# Patient Record
Sex: Female | Born: 1960 | Race: White | Hispanic: No | Smoking: Former smoker
Health system: Southern US, Community
[De-identification: ages and names within clinical notes are randomized; demographics above are authoritative.]

## PROBLEM LIST (undated history)

## (undated) DIAGNOSIS — K219 Gastro-esophageal reflux disease without esophagitis: Secondary | ICD-10-CM

## (undated) DIAGNOSIS — E079 Disorder of thyroid, unspecified: Secondary | ICD-10-CM

## (undated) DIAGNOSIS — I10 Essential (primary) hypertension: Secondary | ICD-10-CM

## (undated) DIAGNOSIS — T7840XA Allergy, unspecified, initial encounter: Secondary | ICD-10-CM

## (undated) HISTORY — DX: Allergy, unspecified, initial encounter: T78.40XA

## (undated) HISTORY — DX: Gastro-esophageal reflux disease without esophagitis: K21.9

## (undated) HISTORY — DX: Essential (primary) hypertension: I10

## (undated) HISTORY — DX: Disorder of thyroid, unspecified: E07.9

---

## 1999-08-16 ENCOUNTER — Encounter: Admission: RE | Admit: 1999-08-16 | Discharge: 1999-08-16 | Payer: Self-pay | Admitting: Family Medicine

## 1999-08-16 ENCOUNTER — Encounter: Payer: Self-pay | Admitting: Family Medicine

## 2000-08-20 ENCOUNTER — Encounter: Admission: RE | Admit: 2000-08-20 | Discharge: 2000-08-20 | Payer: Self-pay | Admitting: Family Medicine

## 2000-08-20 ENCOUNTER — Encounter: Payer: Self-pay | Admitting: Family Medicine

## 2001-09-04 ENCOUNTER — Encounter: Payer: Self-pay | Admitting: Family Medicine

## 2001-09-04 ENCOUNTER — Encounter: Admission: RE | Admit: 2001-09-04 | Discharge: 2001-09-04 | Payer: Self-pay | Admitting: Family Medicine

## 2002-09-07 ENCOUNTER — Encounter: Admission: RE | Admit: 2002-09-07 | Discharge: 2002-09-07 | Payer: Self-pay | Admitting: Family Medicine

## 2002-09-07 ENCOUNTER — Encounter: Payer: Self-pay | Admitting: Family Medicine

## 2003-09-29 ENCOUNTER — Encounter: Admission: RE | Admit: 2003-09-29 | Discharge: 2003-09-29 | Payer: Self-pay | Admitting: Family Medicine

## 2004-10-05 ENCOUNTER — Ambulatory Visit (HOSPITAL_COMMUNITY): Admission: RE | Admit: 2004-10-05 | Discharge: 2004-10-05 | Payer: Self-pay | Admitting: Family Medicine

## 2005-11-29 ENCOUNTER — Ambulatory Visit (HOSPITAL_COMMUNITY): Admission: RE | Admit: 2005-11-29 | Discharge: 2005-11-29 | Payer: Self-pay | Admitting: Internal Medicine

## 2006-12-02 ENCOUNTER — Ambulatory Visit (HOSPITAL_COMMUNITY): Admission: RE | Admit: 2006-12-02 | Discharge: 2006-12-02 | Payer: Self-pay | Admitting: Family Medicine

## 2007-12-03 ENCOUNTER — Ambulatory Visit (HOSPITAL_COMMUNITY): Admission: RE | Admit: 2007-12-03 | Discharge: 2007-12-03 | Payer: Self-pay | Admitting: Family Medicine

## 2008-12-06 ENCOUNTER — Ambulatory Visit (HOSPITAL_COMMUNITY): Admission: RE | Admit: 2008-12-06 | Discharge: 2008-12-06 | Payer: Self-pay | Admitting: Family Medicine

## 2009-12-08 ENCOUNTER — Ambulatory Visit (HOSPITAL_COMMUNITY): Admission: RE | Admit: 2009-12-08 | Discharge: 2009-12-08 | Payer: Self-pay | Admitting: Internal Medicine

## 2010-10-14 ENCOUNTER — Encounter: Payer: Self-pay | Admitting: Family Medicine

## 2010-11-17 ENCOUNTER — Other Ambulatory Visit (HOSPITAL_COMMUNITY): Payer: Self-pay | Admitting: Family Medicine

## 2010-11-17 DIAGNOSIS — Z139 Encounter for screening, unspecified: Secondary | ICD-10-CM

## 2010-12-11 ENCOUNTER — Ambulatory Visit (HOSPITAL_COMMUNITY)
Admission: RE | Admit: 2010-12-11 | Discharge: 2010-12-11 | Disposition: A | Discharge status: Home / Self Care | Payer: PRIVATE HEALTH INSURANCE | Source: Ambulatory Visit | Attending: Family Medicine | Admitting: Family Medicine

## 2010-12-11 DIAGNOSIS — Z139 Encounter for screening, unspecified: Secondary | ICD-10-CM

## 2010-12-11 DIAGNOSIS — Z1231 Encounter for screening mammogram for malignant neoplasm of breast: Secondary | ICD-10-CM | POA: Insufficient documentation

## 2011-11-27 ENCOUNTER — Other Ambulatory Visit: Payer: Self-pay | Admitting: Family Medicine

## 2011-11-27 DIAGNOSIS — Z139 Encounter for screening, unspecified: Secondary | ICD-10-CM

## 2011-12-14 ENCOUNTER — Ambulatory Visit (HOSPITAL_COMMUNITY)
Admission: RE | Admit: 2011-12-14 | Discharge: 2011-12-14 | Disposition: A | Discharge status: Home / Self Care | Payer: PRIVATE HEALTH INSURANCE | Source: Ambulatory Visit | Attending: Family Medicine | Admitting: Family Medicine

## 2011-12-14 DIAGNOSIS — Z139 Encounter for screening, unspecified: Secondary | ICD-10-CM

## 2011-12-14 DIAGNOSIS — Z1231 Encounter for screening mammogram for malignant neoplasm of breast: Secondary | ICD-10-CM | POA: Insufficient documentation

## 2012-09-24 HISTORY — PX: THYROID SURGERY: SHX805

## 2012-12-05 DIAGNOSIS — I10 Essential (primary) hypertension: Secondary | ICD-10-CM | POA: Insufficient documentation

## 2012-12-05 DIAGNOSIS — C73 Malignant neoplasm of thyroid gland: Secondary | ICD-10-CM | POA: Insufficient documentation

## 2012-12-11 ENCOUNTER — Other Ambulatory Visit (HOSPITAL_COMMUNITY): Payer: Self-pay | Admitting: *Deleted

## 2012-12-11 DIAGNOSIS — Z139 Encounter for screening, unspecified: Secondary | ICD-10-CM

## 2012-12-16 ENCOUNTER — Ambulatory Visit (HOSPITAL_COMMUNITY)
Admission: RE | Admit: 2012-12-16 | Discharge: 2012-12-16 | Disposition: A | Discharge status: Home / Self Care | Payer: PRIVATE HEALTH INSURANCE | Source: Ambulatory Visit | Attending: *Deleted | Admitting: *Deleted

## 2012-12-16 DIAGNOSIS — Z139 Encounter for screening, unspecified: Secondary | ICD-10-CM

## 2012-12-16 DIAGNOSIS — Z1231 Encounter for screening mammogram for malignant neoplasm of breast: Secondary | ICD-10-CM | POA: Insufficient documentation

## 2013-08-11 ENCOUNTER — Other Ambulatory Visit: Payer: Self-pay | Admitting: Nurse Practitioner

## 2014-02-08 ENCOUNTER — Other Ambulatory Visit (HOSPITAL_COMMUNITY): Payer: Self-pay | Admitting: *Deleted

## 2014-02-08 DIAGNOSIS — Z139 Encounter for screening, unspecified: Secondary | ICD-10-CM

## 2014-02-11 ENCOUNTER — Ambulatory Visit (HOSPITAL_COMMUNITY)
Admission: RE | Admit: 2014-02-11 | Discharge: 2014-02-11 | Disposition: A | Discharge status: Home / Self Care | Payer: 59 | Source: Ambulatory Visit | Attending: *Deleted | Admitting: *Deleted

## 2014-02-11 DIAGNOSIS — Z1231 Encounter for screening mammogram for malignant neoplasm of breast: Secondary | ICD-10-CM | POA: Insufficient documentation

## 2014-02-11 DIAGNOSIS — Z139 Encounter for screening, unspecified: Secondary | ICD-10-CM

## 2015-01-24 ENCOUNTER — Other Ambulatory Visit (HOSPITAL_COMMUNITY): Payer: Self-pay | Admitting: *Deleted

## 2015-01-24 DIAGNOSIS — Z1231 Encounter for screening mammogram for malignant neoplasm of breast: Secondary | ICD-10-CM

## 2015-02-14 ENCOUNTER — Ambulatory Visit (HOSPITAL_COMMUNITY)
Admission: RE | Admit: 2015-02-14 | Discharge: 2015-02-14 | Disposition: A | Discharge status: Home / Self Care | Payer: 59 | Source: Ambulatory Visit | Attending: *Deleted | Admitting: *Deleted

## 2015-02-14 DIAGNOSIS — Z1231 Encounter for screening mammogram for malignant neoplasm of breast: Secondary | ICD-10-CM

## 2016-02-13 ENCOUNTER — Other Ambulatory Visit (HOSPITAL_COMMUNITY): Payer: Self-pay | Admitting: *Deleted

## 2016-02-13 DIAGNOSIS — Z1231 Encounter for screening mammogram for malignant neoplasm of breast: Secondary | ICD-10-CM

## 2016-02-22 ENCOUNTER — Ambulatory Visit (HOSPITAL_COMMUNITY)
Admission: RE | Admit: 2016-02-22 | Discharge: 2016-02-22 | Disposition: A | Discharge status: Home / Self Care | Payer: Managed Care, Other (non HMO) | Source: Ambulatory Visit | Attending: *Deleted | Admitting: *Deleted

## 2016-02-22 DIAGNOSIS — Z1231 Encounter for screening mammogram for malignant neoplasm of breast: Secondary | ICD-10-CM | POA: Diagnosis not present

## 2016-06-08 ENCOUNTER — Other Ambulatory Visit: Payer: Self-pay | Admitting: *Deleted

## 2016-06-11 MED ORDER — GABAPENTIN 600 MG PO TABS: 600.0000 mg | ORAL_TABLET | Freq: Every day | ORAL | 1 refills | Status: DC

## 2016-07-02 ENCOUNTER — Telehealth: Payer: Self-pay | Admitting: Physician Assistant

## 2016-07-02 DIAGNOSIS — E039 Hypothyroidism, unspecified: Secondary | ICD-10-CM

## 2016-07-02 NOTE — Telephone Encounter (Signed)
Is it ok for thyroid check or does patient need to be seen?

## 2016-07-02 NOTE — Telephone Encounter (Signed)
Hypothyroidism, labs: thyroid panel

## 2016-07-02 NOTE — Telephone Encounter (Signed)
Detailed message left that lab order has been placed.

## 2016-07-06 ENCOUNTER — Other Ambulatory Visit (INDEPENDENT_AMBULATORY_CARE_PROVIDER_SITE_OTHER): Payer: Self-pay

## 2016-07-06 DIAGNOSIS — E039 Hypothyroidism, unspecified: Secondary | ICD-10-CM

## 2016-07-07 LAB — THYROID PANEL WITH TSH
Free Thyroxine Index: 2.6 (ref 1.2–4.9)
T3 Uptake Ratio: 33 % (ref 24–39)
T4, Total: 7.9 ug/dL (ref 4.5–12.0)
TSH: 0.727 u[IU]/mL (ref 0.450–4.500)

## 2016-07-11 ENCOUNTER — Other Ambulatory Visit: Payer: Self-pay | Admitting: Physician Assistant

## 2016-07-11 MED ORDER — LEVOTHYROXINE SODIUM 50 MCG PO TABS: 50.0000 ug | ORAL_TABLET | Freq: Every day | ORAL | 6 refills | Status: DC

## 2016-07-11 NOTE — Telephone Encounter (Signed)
Message left for patient that rx has been sent to pharmacy.

## 2016-07-11 NOTE — Telephone Encounter (Signed)
sent 

## 2016-07-11 NOTE — Telephone Encounter (Signed)
Patient aware of thyroid lab results.  She needs levothyroxine 95mcg sent to [facility] , please.

## 2016-08-11 ENCOUNTER — Other Ambulatory Visit: Payer: Self-pay | Admitting: Physician Assistant

## 2016-12-17 ENCOUNTER — Other Ambulatory Visit: Payer: Self-pay | Admitting: Physician Assistant

## 2017-01-24 ENCOUNTER — Other Ambulatory Visit: Payer: Self-pay | Admitting: Physician Assistant

## 2017-01-24 DIAGNOSIS — Z1231 Encounter for screening mammogram for malignant neoplasm of breast: Secondary | ICD-10-CM

## 2017-02-05 ENCOUNTER — Encounter: Payer: Self-pay | Admitting: Physician Assistant

## 2017-02-05 ENCOUNTER — Ambulatory Visit (INDEPENDENT_AMBULATORY_CARE_PROVIDER_SITE_OTHER): Payer: BLUE CROSS/BLUE SHIELD | Admitting: Physician Assistant

## 2017-02-05 VITALS — BP 144/76 | HR 64 | Temp 97.5°F | Ht 65.0 in | Wt 187.6 lb

## 2017-02-05 DIAGNOSIS — Z0000 Encounter for general adult medical examination without abnormal findings: Secondary | ICD-10-CM

## 2017-02-05 DIAGNOSIS — Z01419 Encounter for gynecological examination (general) (routine) without abnormal findings: Secondary | ICD-10-CM

## 2017-02-05 MED ORDER — LEVOTHYROXINE SODIUM 50 MCG PO TABS: 50.0000 ug | ORAL_TABLET | Freq: Every day | ORAL | 11 refills | Status: DC

## 2017-02-05 MED ORDER — DIAZEPAM 5 MG PO TABS: 5.0000 mg | ORAL_TABLET | Freq: Four times a day (QID) | ORAL | 1 refills | Status: DC | PRN

## 2017-02-05 MED ORDER — LISINOPRIL-HYDROCHLOROTHIAZIDE 20-12.5 MG PO TABS: 1.0000 | ORAL_TABLET | Freq: Every day | ORAL | 11 refills | Status: DC

## 2017-02-05 MED ORDER — GABAPENTIN 600 MG PO TABS: 600.0000 mg | ORAL_TABLET | Freq: Every day | ORAL | 11 refills | Status: DC

## 2017-02-05 NOTE — Patient Instructions (Signed)
Health Maintenance, Female Adopting a healthy lifestyle and getting preventive care can go a long way to promote health and wellness. Talk with your health care provider about what schedule of regular examinations is right for you. This is a good chance for you to check in with your provider about disease prevention and staying healthy. In between checkups, there are plenty of things you can do on your own. Experts have done a lot of research about which lifestyle changes and preventive measures are most likely to keep you healthy. Ask your health care provider for more information. Weight and diet Eat a healthy diet  Be sure to include plenty of vegetables, fruits, low-fat dairy products, and lean protein.  Do not eat a lot of foods high in solid fats, added sugars, or salt.  Get regular exercise. This is one of the most important things you can do for your health.  Most adults should exercise for at least 150 minutes each week. The exercise should increase your heart rate and make you sweat (moderate-intensity exercise).  Most adults should also do strengthening exercises at least twice a week. This is in addition to the moderate-intensity exercise. Maintain a healthy weight  Body mass index (BMI) is a measurement that can be used to identify possible weight problems. It estimates body fat based on height and weight. Your health care provider can help determine your BMI and help you achieve or maintain a healthy weight.  For females 76 years of age and older:  A BMI below 18.5 is considered underweight.  A BMI of 18.5 to 24.9 is normal.  A BMI of 25 to 29.9 is considered overweight.  A BMI of 30 and above is considered obese. Watch levels of cholesterol and blood lipids  You should start having your blood tested for lipids and cholesterol at 58 years of age, then have this test every 5 years.  You may need to have your cholesterol levels checked more often if:  Your lipid or  cholesterol levels are high.  You are older than 58 years of age.  You are at high risk for heart disease. Cancer screening Lung Cancer  Lung cancer screening is recommended for adults 64-42 years old who are at high risk for lung cancer because of a history of smoking.  A yearly low-dose CT scan of the lungs is recommended for people who:  Currently smoke.  Have quit within the past 15 years.  Have at least a 30-pack-year history of smoking. A pack year is smoking an average of one pack of cigarettes a day for 1 year.  Yearly screening should continue until it has been 15 years since you quit.  Yearly screening should stop if you develop a health problem that would prevent you from having lung cancer treatment. Breast Cancer  Practice breast self-awareness. This means understanding how your breasts normally appear and feel.  It also means doing regular breast self-exams. Let your health care provider know about any changes, no matter how small.  If you are in your 20s or 30s, you should have a clinical breast exam (CBE) by a health care provider every 1-3 years as part of a regular health exam.  If you are 34 or older, have a CBE every year. Also consider having a breast X-ray (mammogram) every year.  If you have a family history of breast cancer, talk to your health care provider about genetic screening.  If you are at high risk for breast cancer, talk  to your health care provider about having an MRI and a mammogram every year.  Breast cancer gene (BRCA) assessment is recommended for women who have family members with BRCA-related cancers. BRCA-related cancers include:  Breast.  Ovarian.  Tubal.  Peritoneal cancers.  Results of the assessment will determine the need for genetic counseling and BRCA1 and BRCA2 testing. Cervical Cancer  Your health care provider may recommend that you be screened regularly for cancer of the pelvic organs (ovaries, uterus, and vagina).  This screening involves a pelvic examination, including checking for microscopic changes to the surface of your cervix (Pap test). You may be encouraged to have this screening done every 3 years, beginning at age 24.  For women ages 66-65, health care providers may recommend pelvic exams and Pap testing every 3 years, or they may recommend the Pap and pelvic exam, combined with testing for human papilloma virus (HPV), every 5 years. Some types of HPV increase your risk of cervical cancer. Testing for HPV may also be done on women of any age with unclear Pap test results.  Other health care providers may not recommend any screening for nonpregnant women who are considered low risk for pelvic cancer and who do not have symptoms. Ask your health care provider if a screening pelvic exam is right for you.  If you have had past treatment for cervical cancer or a condition that could lead to cancer, you need Pap tests and screening for cancer for at least 20 years after your treatment. If Pap tests have been discontinued, your risk factors (such as having a new sexual partner) need to be reassessed to determine if screening should resume. Some women have medical problems that increase the chance of getting cervical cancer. In these cases, your health care provider may recommend more frequent screening and Pap tests. Colorectal Cancer  This type of cancer can be detected and often prevented.  Routine colorectal cancer screening usually begins at 58 years of age and continues through 58 years of age.  Your health care provider may recommend screening at an earlier age if you have risk factors for colon cancer.  Your health care provider may also recommend using home test kits to check for hidden blood in the stool.  A small camera at the end of a tube can be used to examine your colon directly (sigmoidoscopy or colonoscopy). This is done to check for the earliest forms of colorectal cancer.  Routine  screening usually begins at age 41.  Direct examination of the colon should be repeated every 5-10 years through 58 years of age. However, you may need to be screened more often if early forms of precancerous polyps or small growths are found. Skin Cancer  Check your skin from head to toe regularly.  Tell your health care provider about any new moles or changes in moles, especially if there is a change in a mole's shape or color.  Also tell your health care provider if you have a mole that is larger than the size of a pencil eraser.  Always use sunscreen. Apply sunscreen liberally and repeatedly throughout the day.  Protect yourself by wearing long sleeves, pants, a wide-brimmed hat, and sunglasses whenever you are outside. Heart disease, diabetes, and high blood pressure  High blood pressure causes heart disease and increases the risk of stroke. High blood pressure is more likely to develop in:  People who have blood pressure in the high end of the normal range (130-139/85-89 mm Hg).  People who are overweight or obese.  People who are African American.  If you are 59-24 years of age, have your blood pressure checked every 3-5 years. If you are 34 years of age or older, have your blood pressure checked every year. You should have your blood pressure measured twice-once when you are at a hospital or clinic, and once when you are not at a hospital or clinic. Record the average of the two measurements. To check your blood pressure when you are not at a hospital or clinic, you can use:  An automated blood pressure machine at a pharmacy.  A home blood pressure monitor.  If you are between 29 years and 60 years old, ask your health care provider if you should take aspirin to prevent strokes.  Have regular diabetes screenings. This involves taking a blood sample to check your fasting blood sugar level.  If you are at a normal weight and have a low risk for diabetes, have this test once  every three years after 58 years of age.  If you are overweight and have a high risk for diabetes, consider being tested at a younger age or more often. Preventing infection Hepatitis B  If you have a higher risk for hepatitis B, you should be screened for this virus. You are considered at high risk for hepatitis B if:  You were born in a country where hepatitis B is common. Ask your health care provider which countries are considered high risk.  Your parents were born in a high-risk country, and you have not been immunized against hepatitis B (hepatitis B vaccine).  You have HIV or AIDS.  You use needles to inject street drugs.  You live with someone who has hepatitis B.  You have had sex with someone who has hepatitis B.  You get hemodialysis treatment.  You take certain medicines for conditions, including cancer, organ transplantation, and autoimmune conditions. Hepatitis C  Blood testing is recommended for:  Everyone born from [birth year] through 1967.  Anyone with known risk factors for hepatitis C. Sexually transmitted infections (STIs)  You should be screened for sexually transmitted infections (STIs) including gonorrhea and chlamydia if:  You are sexually active and are younger than 58 years of age.  You are older than 58 years of age and your health care provider tells you that you are at risk for this type of infection.  Your sexual activity has changed since you were last screened and you are at an increased risk for chlamydia or gonorrhea. Ask your health care provider if you are at risk.  If you do not have HIV, but are at risk, it may be recommended that you take a prescription medicine daily to prevent HIV infection. This is called pre-exposure prophylaxis (PrEP). You are considered at risk if:  You are sexually active and do not regularly use condoms or know the HIV status of your partner(s).  You take drugs by injection.  You are sexually active with a partner  who has HIV. Talk with your health care provider about whether you are at high risk of being infected with HIV. If you choose to begin PrEP, you should first be tested for HIV. You should then be tested every 3 months for as long as you are taking PrEP. Pregnancy  If you are premenopausal and you may become pregnant, ask your health care provider about preconception counseling.  If you may become pregnant, take 400 to 800 micrograms (mcg) of folic acid  every day.  If you want to prevent pregnancy, talk to your health care provider about birth control (contraception). Osteoporosis and menopause  Osteoporosis is a disease in which the bones lose minerals and strength with aging. This can result in serious bone fractures. Your risk for osteoporosis can be identified using a bone density scan.  If you are 4 years of age or older, or if you are at risk for osteoporosis and fractures, ask your health care provider if you should be screened.  Ask your health care provider whether you should take a calcium or vitamin D supplement to lower your risk for osteoporosis.  Menopause may have certain physical symptoms and risks.  Hormone replacement therapy may reduce some of these symptoms and risks. Talk to your health care provider about whether hormone replacement therapy is right for you. Follow these instructions at home:  Schedule regular health, dental, and eye exams.  Stay current with your immunizations.  Do not use any tobacco products including cigarettes, chewing tobacco, or electronic cigarettes.  If you are pregnant, do not drink alcohol.  If you are breastfeeding, limit how much and how often you drink alcohol.  Limit alcohol intake to no more than 1 drink per day for nonpregnant women. One drink equals 12 ounces of beer, 5 ounces of wine, or 1 ounces of hard liquor.  Do not use street drugs.  Do not share needles.  Ask your health care provider for help if you need support  or information about quitting drugs.  Tell your health care provider if you often feel depressed.  Tell your health care provider if you have ever been abused or do not feel safe at home. This information is not intended to replace advice given to you by your health care provider. Make sure you discuss any questions you have with your health care provider. Document Released: 03/26/2011 Document Revised: 02/16/2016 Document Reviewed: 06/14/2015 Elsevier Interactive Patient Education  2017 [employer].

## 2017-02-05 NOTE — Progress Notes (Signed)
BP (!) 144/76   Pulse 64   Temp 97.5 F (36.4 C) (Oral)   Ht _0  (1.651 m)   Wt 187 lb 9.6 oz (85.1 kg)   BMI 31.22 kg/m    Subjective:    Patient ID: Pamela Santiago, female    DOB: December 19, 1960, 56 y.o.   MRN: [id number]  [name] is a 56 y.o. female presenting on 02/05/2017 for Gynecologic Exam  HPI This patient comes in for annual well physical examination. All medications are reviewed today. There are no reports of any problems with the medications. All of the medical conditions are reviewed and updated.  Lab work is reviewed and will be ordered as medically necessary. There are no new problems reported with today's visit.  Patient reports doing well overall.   Past Medical History:  Diagnosis Date  . Allergy   . GERD (gastroesophageal reflux disease)   . Hypertension   . Thyroid disease    Relevant past medical, surgical, family and social history reviewed and updated as indicated. Interim medical history since our last visit reviewed. Allergies and medications reviewed and updated.   Data reviewed from any sources in EPIC.  Review of Systems  Constitutional: Negative.  Negative for activity change, fatigue and fever.  HENT: Negative.   Eyes: Negative.   Respiratory: Negative.  Negative for cough.   Cardiovascular: Negative.  Negative for chest pain.  Gastrointestinal: Negative.  Negative for abdominal pain.  Endocrine: Negative.   Genitourinary: Negative.  Negative for dysuria.  Musculoskeletal: Negative.   Skin: Negative.   Neurological: Negative.      Social History   Social History  . Marital status: Divorced    Spouse name: N/A  . Number of children: N/A  . Years of education: N/A   Occupational History  . Not on file.   Social History Main Topics  . Smoking status: Former Smoker    Quit date: 02/05/2002  . Smokeless tobacco: Never Used  . Alcohol use Yes     Comment: occasional  . Drug use: No  . Sexual activity: Not on file   Other  Topics Concern  . Not on file   Social History Narrative  . No narrative on file    Past Surgical History:  Procedure Laterality Date  . THYROID SURGERY  2014    History reviewed. No pertinent family history.  Allergies as of 02/05/2017      Reactions   Penicillins Hives      Medication List       Accurate as of 02/05/17  2:17 PM. Always use your most recent med list.          Biotin 10000 MCG Tabs Take 1 tablet by mouth daily.   CALTRATE 600+D 600-400 MG-UNIT tablet Generic drug:  Calcium Carbonate-Vitamin D Take 1 tablet by mouth daily.   cetirizine 10 MG tablet Commonly known as:  ZYRTEC Take 10 mg by mouth daily.   cholecalciferol 400 units Tabs tablet Commonly known as:  VITAMIN D Take 400 Units by mouth daily.   diazepam 5 MG tablet Commonly known as:  VALIUM Take 1 tablet (5 mg total) by mouth every 6 (six) hours as needed for anxiety.   FISH OIL PEARLS 300 MG Caps Take 2 capsules by mouth daily.   gabapentin 600 MG tablet Commonly known as:  NEURONTIN Take 1 tablet (600 mg total) by mouth at bedtime.   levothyroxine 50 MCG tablet Commonly known as:  SYNTHROID, LEVOTHROID  Take 1 tablet (50 mcg total) by mouth daily.   lisinopril-hydrochlorothiazide 20-12.5 MG tablet Commonly known as:  PRINZIDE,ZESTORETIC Take 1 tablet by mouth daily.   omeprazole 20 MG capsule Commonly known as:  PRILOSEC Take 20 mg by mouth daily.   ranitidine 150 MG capsule Commonly known as:  ZANTAC Take 150 mg by mouth 2 (two) times daily as needed for heartburn.          Objective:    BP (!) 144/76   Pulse 64   Temp 97.5 F (36.4 C) (Oral)   Ht _0  (1.651 m)   Wt 187 lb 9.6 oz (85.1 kg)   BMI 31.22 kg/m   Allergies  Allergen Reactions  . Penicillins Hives   Wt Readings from Last 3 Encounters:  02/05/17 187 lb 9.6 oz (85.1 kg)    Physical Exam  Constitutional: She is oriented to person, place, and time. She appears well-developed and  well-nourished.  HENT:  Head: Normocephalic and atraumatic.  Eyes: Conjunctivae and EOM are normal. Pupils are equal, round, and reactive to light.  Neck: Normal range of motion. Neck supple.  Cardiovascular: Normal rate, regular rhythm, normal heart sounds and intact distal pulses.   Pulmonary/Chest: Effort normal and breath sounds normal. Right breast exhibits no mass, no skin change and no tenderness. Left breast exhibits no mass, no skin change and no tenderness. Breasts are symmetrical.  Abdominal: Soft. Bowel sounds are normal.  Genitourinary: Vagina normal and uterus normal. Rectal exam shows no fissure. No breast swelling, tenderness, discharge or bleeding. There is no tenderness or lesion on the right labia. There is no tenderness or lesion on the left labia. Uterus is not deviated, not enlarged and not tender. Cervix exhibits no motion tenderness, no discharge and no friability. Right adnexum displays no mass, no tenderness and no fullness. Left adnexum displays no mass, no tenderness and no fullness. No tenderness or bleeding in the vagina. No vaginal discharge found.  Neurological: She is alert and oriented to person, place, and time. She has normal reflexes.  Skin: Skin is warm and dry. No rash noted.  Psychiatric: She has a normal mood and affect. Her behavior is normal. Judgment and thought content normal.  Nursing note and vitals reviewed.       Assessment & Plan:   1. Well female exam with routine gynecological exam - CMP14+EGFR - Lipid panel - TSH - CBC with Differential/Platelet - Pap IG w/ reflex to HPV when [facility])   Current Outpatient Prescriptions:  .  Biotin 10000 MCG TABS, Take 1 tablet by mouth daily., Disp: , Rfl:  .  Calcium Carbonate-Vitamin D (CALTRATE 600+D) 600-400 MG-UNIT tablet, Take 1 tablet by mouth daily., Disp: , Rfl:  .  cetirizine (ZYRTEC) 10 MG tablet, Take 10 mg by mouth daily., Disp: , Rfl:  .  cholecalciferol (VITAMIN D) 400  units TABS tablet, Take 400 Units by mouth daily., Disp: , Rfl:  .  diazepam (VALIUM) 5 MG tablet, Take 1 tablet (5 mg total) by mouth every 6 (six) hours as needed for anxiety., Disp: 40 tablet, Rfl: 1 .  gabapentin (NEURONTIN) 600 MG tablet, Take 1 tablet (600 mg total) by mouth at bedtime., Disp: 30 tablet, Rfl: 11 .  levothyroxine (SYNTHROID, LEVOTHROID) 50 MCG tablet, Take 1 tablet (50 mcg total) by mouth daily., Disp: 30 tablet, Rfl: 11 .  lisinopril-hydrochlorothiazide (PRINZIDE,ZESTORETIC) 20-12.5 MG tablet, Take 1 tablet by mouth daily., Disp: 30 tablet, Rfl: 11 .  Omega-3 Fatty  Acids (FISH OIL PEARLS) 300 MG CAPS, Take 2 capsules by mouth daily., Disp: , Rfl:  .  omeprazole (PRILOSEC) 20 MG capsule, Take 20 mg by mouth daily., Disp: , Rfl:  .  ranitidine (ZANTAC) 150 MG capsule, Take 150 mg by mouth 2 (two) times daily as needed for heartburn., Disp: , Rfl:   Continue all other maintenance medications as listed above. Educational handout given for health maintenance  Follow up plan: Return in about 1 year (around 02/05/2018) for well.  Terald Sleeper PA-C [place] [address]  [place], [place] [address] [phone]   02/05/2017, 2:17 PM

## 2017-02-06 LAB — CBC WITH DIFFERENTIAL/PLATELET
Basophils Absolute: 0 10*3/uL (ref 0.0–0.2)
Basos: 0 %
EOS (ABSOLUTE): 0.1 10*3/uL (ref 0.0–0.4)
Eos: 1 %
Hematocrit: 41.2 % (ref 34.0–46.6)
Hemoglobin: 13.6 g/dL (ref 11.1–15.9)
Immature Grans (Abs): 0 10*3/uL (ref 0.0–0.1)
Immature Granulocytes: 0 %
Lymphocytes Absolute: 3.3 10*3/uL — ABNORMAL HIGH (ref 0.7–3.1)
Lymphs: 44 %
MCH: 32.2 pg (ref 26.6–33.0)
MCHC: 33 g/dL (ref 31.5–35.7)
MCV: 97 fL (ref 79–97)
Monocytes Absolute: 0.5 10*3/uL (ref 0.1–0.9)
Monocytes: 6 %
Neutrophils Absolute: 3.7 10*3/uL (ref 1.4–7.0)
Neutrophils: 49 %
Platelets: 247 10*3/uL (ref 150–379)
RBC: 4.23 x10E6/uL (ref 3.77–5.28)
RDW: 13.7 % (ref 12.3–15.4)
WBC: 7.5 10*3/uL (ref 3.4–10.8)

## 2017-02-06 LAB — LIPID PANEL
Chol/HDL Ratio: 3.6 ratio (ref 0.0–4.4)
Cholesterol, Total: 268 mg/dL — ABNORMAL HIGH (ref 100–199)
HDL: 74 mg/dL (ref >39)
LDL Calculated: 169 mg/dL — ABNORMAL HIGH (ref 0–99)
Triglycerides: 125 mg/dL (ref 0–149)
VLDL Cholesterol Cal: 25 mg/dL (ref 5–40)

## 2017-02-06 LAB — CMP14+EGFR
ALT: 45 IU/L — ABNORMAL HIGH (ref 0–32)
AST: 35 IU/L (ref 0–40)
Albumin/Globulin Ratio: 1.5 (ref 1.2–2.2)
Albumin: 4.5 g/dL (ref 3.5–5.5)
Alkaline Phosphatase: 79 IU/L (ref 39–117)
BUN/Creatinine Ratio: 23 (ref 9–23)
BUN: 14 mg/dL (ref 6–24)
Bilirubin Total: 0.5 mg/dL (ref 0.0–1.2)
CO2: 22 mmol/L (ref 18–29)
Calcium: 9.5 mg/dL (ref 8.7–10.2)
Chloride: 96 mmol/L (ref 96–106)
Creatinine, Ser: 0.62 mg/dL (ref 0.57–1.00)
GFR calc Af Amer: 116 mL/min/{1.73_m2} (ref >59)
GFR calc non Af Amer: 100 mL/min/{1.73_m2} (ref >59)
Globulin, Total: 3.1 g/dL (ref 1.5–4.5)
Glucose: 72 mg/dL (ref 65–99)
Potassium: 4.3 mmol/L (ref 3.5–5.2)
Sodium: 137 mmol/L (ref 134–144)
Total Protein: 7.6 g/dL (ref 6.0–8.5)

## 2017-02-06 LAB — TSH: TSH: 1.32 u[IU]/mL (ref 0.450–4.500)

## 2017-02-07 LAB — PAP IG W/ RFLX HPV ASCU: PAP Smear Comment: 0

## 2017-02-08 ENCOUNTER — Telehealth: Payer: Self-pay | Admitting: Physician Assistant

## 2017-02-08 NOTE — Telephone Encounter (Signed)
Returned patient's phone call.   Patient states that Cholesterol is better this time that last LDL was 190 last time

## 2017-02-25 ENCOUNTER — Ambulatory Visit (HOSPITAL_COMMUNITY)
Admission: RE | Admit: 2017-02-25 | Discharge: 2017-02-25 | Disposition: A | Discharge status: Home / Self Care | Payer: BLUE CROSS/BLUE SHIELD | Source: Ambulatory Visit | Attending: Physician Assistant | Admitting: Physician Assistant

## 2017-02-25 DIAGNOSIS — Z1231 Encounter for screening mammogram for malignant neoplasm of breast: Secondary | ICD-10-CM | POA: Insufficient documentation

## 2017-04-17 DIAGNOSIS — C73 Malignant neoplasm of thyroid gland: Secondary | ICD-10-CM | POA: Diagnosis not present

## 2018-02-11 ENCOUNTER — Other Ambulatory Visit: Payer: Self-pay | Admitting: Physician Assistant

## 2018-02-11 DIAGNOSIS — Z1231 Encounter for screening mammogram for malignant neoplasm of breast: Secondary | ICD-10-CM

## 2018-02-19 ENCOUNTER — Other Ambulatory Visit: Payer: Self-pay | Admitting: Physician Assistant

## 2018-02-27 ENCOUNTER — Encounter (HOSPITAL_COMMUNITY): Payer: Self-pay

## 2018-02-27 ENCOUNTER — Ambulatory Visit (HOSPITAL_COMMUNITY)
Admission: RE | Admit: 2018-02-27 | Discharge: 2018-02-27 | Disposition: A | Discharge status: Home / Self Care | Payer: BLUE CROSS/BLUE SHIELD | Source: Ambulatory Visit | Attending: Physician Assistant | Admitting: Physician Assistant

## 2018-02-27 ENCOUNTER — Telehealth: Payer: Self-pay | Admitting: Physician Assistant

## 2018-02-27 DIAGNOSIS — Z1231 Encounter for screening mammogram for malignant neoplasm of breast: Secondary | ICD-10-CM | POA: Diagnosis not present

## 2018-02-28 ENCOUNTER — Ambulatory Visit (INDEPENDENT_AMBULATORY_CARE_PROVIDER_SITE_OTHER): Payer: BLUE CROSS/BLUE SHIELD | Admitting: Physician Assistant

## 2018-02-28 ENCOUNTER — Encounter: Payer: Self-pay | Admitting: Physician Assistant

## 2018-02-28 VITALS — BP 135/80 | HR 74 | Temp 97.8°F | Ht 65.0 in | Wt 187.0 lb

## 2018-02-28 DIAGNOSIS — Z01419 Encounter for gynecological examination (general) (routine) without abnormal findings: Secondary | ICD-10-CM

## 2018-02-28 DIAGNOSIS — L309 Dermatitis, unspecified: Secondary | ICD-10-CM

## 2018-02-28 DIAGNOSIS — E039 Hypothyroidism, unspecified: Secondary | ICD-10-CM | POA: Diagnosis not present

## 2018-02-28 DIAGNOSIS — C73 Malignant neoplasm of thyroid gland: Secondary | ICD-10-CM

## 2018-02-28 MED ORDER — GABAPENTIN 600 MG PO TABS: 600.0000 mg | ORAL_TABLET | Freq: Every day | ORAL | 11 refills | Status: DC

## 2018-02-28 MED ORDER — DIAZEPAM 5 MG PO TABS: 5.0000 mg | ORAL_TABLET | Freq: Four times a day (QID) | ORAL | 1 refills | Status: DC | PRN

## 2018-02-28 MED ORDER — CLOBETASOL PROPIONATE 0.05 % EX CREA: 1.0000 "application " | TOPICAL_CREAM | Freq: Two times a day (BID) | CUTANEOUS | 11 refills | Status: DC

## 2018-02-28 MED ORDER — LEVOTHYROXINE SODIUM 50 MCG PO TABS: 50.0000 ug | ORAL_TABLET | Freq: Every day | ORAL | 11 refills | Status: DC

## 2018-03-01 LAB — LIPID PANEL
Chol/HDL Ratio: 4.4 ratio (ref 0.0–4.4)
Cholesterol, Total: 307 mg/dL — ABNORMAL HIGH (ref 100–199)
HDL: 70 mg/dL (ref >39)
LDL Calculated: 211 mg/dL — ABNORMAL HIGH (ref 0–99)
Triglycerides: 132 mg/dL (ref 0–149)
VLDL Cholesterol Cal: 26 mg/dL (ref 5–40)

## 2018-03-01 LAB — CMP14+EGFR
ALT: 35 IU/L — ABNORMAL HIGH (ref 0–32)
AST: 27 IU/L (ref 0–40)
Albumin/Globulin Ratio: 1.4 (ref 1.2–2.2)
Albumin: 4.6 g/dL (ref 3.5–5.5)
Alkaline Phosphatase: 80 IU/L (ref 39–117)
BUN/Creatinine Ratio: 35 — ABNORMAL HIGH (ref 9–23)
BUN: 20 mg/dL (ref 6–24)
Bilirubin Total: 0.4 mg/dL (ref 0.0–1.2)
CO2: 23 mmol/L (ref 20–29)
Calcium: 9.8 mg/dL (ref 8.7–10.2)
Chloride: 97 mmol/L (ref 96–106)
Creatinine, Ser: 0.57 mg/dL (ref 0.57–1.00)
GFR calc Af Amer: 118 mL/min/{1.73_m2} (ref >59)
GFR calc non Af Amer: 103 mL/min/{1.73_m2} (ref >59)
Globulin, Total: 3.2 g/dL (ref 1.5–4.5)
Glucose: 78 mg/dL (ref 65–99)
Potassium: 4.3 mmol/L (ref 3.5–5.2)
Sodium: 138 mmol/L (ref 134–144)
Total Protein: 7.8 g/dL (ref 6.0–8.5)

## 2018-03-01 LAB — THYROID PANEL WITH TSH
Free Thyroxine Index: 2.8 (ref 1.2–4.9)
T3 Uptake Ratio: 30 % (ref 24–39)
T4, Total: 9.3 ug/dL (ref 4.5–12.0)
TSH: 1.21 u[IU]/mL (ref 0.450–4.500)

## 2018-03-01 LAB — CBC WITH DIFFERENTIAL/PLATELET
Basophils Absolute: 0 10*3/uL (ref 0.0–0.2)
Basos: 0 %
EOS (ABSOLUTE): 0.1 10*3/uL (ref 0.0–0.4)
Eos: 1 %
Hematocrit: 44.1 % (ref 34.0–46.6)
Hemoglobin: 14.6 g/dL (ref 11.1–15.9)
Immature Grans (Abs): 0 10*3/uL (ref 0.0–0.1)
Immature Granulocytes: 0 %
Lymphocytes Absolute: 3.3 10*3/uL — ABNORMAL HIGH (ref 0.7–3.1)
Lymphs: 45 %
MCH: 32.7 pg (ref 26.6–33.0)
MCHC: 33.1 g/dL (ref 31.5–35.7)
MCV: 99 fL — ABNORMAL HIGH (ref 79–97)
Monocytes Absolute: 0.5 10*3/uL (ref 0.1–0.9)
Monocytes: 6 %
Neutrophils Absolute: 3.5 10*3/uL (ref 1.4–7.0)
Neutrophils: 48 %
Platelets: 255 10*3/uL (ref 150–450)
RBC: 4.47 x10E6/uL (ref 3.77–5.28)
RDW: 13.5 % (ref 12.3–15.4)
WBC: 7.5 10*3/uL (ref 3.4–10.8)

## 2018-03-02 DIAGNOSIS — L309 Dermatitis, unspecified: Secondary | ICD-10-CM | POA: Insufficient documentation

## 2018-03-02 NOTE — Progress Notes (Signed)
BP 135/80   Pulse 74   Temp 97.8 F (36.6 C) (Oral)   Ht 5' 5"  (1.651 m)   Wt 187 lb (84.8 kg)   BMI 31.12 kg/m    Subjective:    Patient ID: Pamela Santiago, female    DOB: Jun 18, 1961, 57 y.o.   MRN: [id number]  HPI: Pamela Santiago is a 57 y.o. female presenting on 02/28/2018 for Annual Exam  This patient comes in for annual well physical examination. All medications are reviewed today. There are no reports of any problems with the medications. All of the medical conditions are reviewed and updated.  Lab work is reviewed and will be ordered as medically necessary. There are no new problems reported with today's visit.  Patient reports doing well overall.   Past Medical History:  Diagnosis Date  . Allergy   . GERD (gastroesophageal reflux disease)   . Hypertension   . Thyroid disease    Relevant past medical, surgical, family and social history reviewed and updated as indicated. Interim medical history since our last visit reviewed. Allergies and medications reviewed and updated. DATA REVIEWED: CHART IN EPIC  Family History reviewed for pertinent findings.  Review of Systems  Constitutional: Negative.  Negative for activity change, fatigue and fever.  HENT: Negative.   Eyes: Negative.   Respiratory: Negative.  Negative for cough.   Cardiovascular: Negative.  Negative for chest pain.  Gastrointestinal: Negative.  Negative for abdominal pain.  Endocrine: Negative.   Genitourinary: Negative.  Negative for dysuria.  Musculoskeletal: Negative.   Skin: Negative.   Neurological: Negative.     Allergies as of 02/28/2018      Reactions   Penicillins Hives      Medication List        Accurate as of 02/28/18 11:59 PM. Always use your most recent med list.          Biotin 10000 MCG Tabs Take 1 tablet by mouth daily.   CALTRATE 600+D 600-400 MG-UNIT tablet Generic drug:  Calcium Carbonate-Vitamin D Take 1 tablet by mouth daily.   cetirizine 10 MG tablet Commonly  known as:  ZYRTEC Take 10 mg by mouth daily.   cholecalciferol 400 units Tabs tablet Commonly known as:  VITAMIN D Take 400 Units by mouth daily.   clobetasol cream 0.05 % Commonly known as:  TEMOVATE Apply 1 application topically 2 (two) times daily.   diazepam 5 MG tablet Commonly known as:  VALIUM Take 1 tablet (5 mg total) by mouth every 6 (six) hours as needed for anxiety.   FISH OIL PEARLS 300 MG Caps Take 2 capsules by mouth daily.   gabapentin 600 MG tablet Commonly known as:  NEURONTIN Take 1 tablet (600 mg total) by mouth at bedtime.   levothyroxine 50 MCG tablet Commonly known as:  SYNTHROID, LEVOTHROID Take 1 tablet (50 mcg total) by mouth daily.   lisinopril-hydrochlorothiazide 20-12.5 MG tablet Commonly known as:  PRINZIDE,ZESTORETIC TAKE 1 TABLET DAILY   omeprazole 20 MG capsule Commonly known as:  PRILOSEC Take 20 mg by mouth daily.   ranitidine 150 MG capsule Commonly known as:  ZANTAC Take 150 mg by mouth 2 (two) times daily as needed for heartburn.          Objective:    BP 135/80   Pulse 74   Temp 97.8 F (36.6 C) (Oral)   Ht 5' 5"  (1.651 m)   Wt 187 lb (84.8 kg)   BMI 31.12 kg/m   Allergies  Allergen Reactions  . Penicillins Hives    Wt Readings from Last 3 Encounters:  02/28/18 187 lb (84.8 kg)  02/05/17 187 lb 9.6 oz (85.1 kg)    Physical Exam  Constitutional: She is oriented to person, place, and time. She appears well-developed and well-nourished.  HENT:  Head: Normocephalic and atraumatic.  Eyes: Pupils are equal, round, and reactive to light. Conjunctivae and EOM are normal.  Neck: Normal range of motion. Neck supple.  Cardiovascular: Normal rate, regular rhythm, normal heart sounds and intact distal pulses.  Pulmonary/Chest: Effort normal and breath sounds normal. Right breast exhibits no mass, no skin change and no tenderness. Left breast exhibits no mass, no skin change and no tenderness. No breast tenderness, discharge  or bleeding. Breasts are symmetrical.  Abdominal: Soft. Bowel sounds are normal.  Genitourinary: Vagina normal and uterus normal. Rectal exam shows no fissure. No breast tenderness, discharge or bleeding. There is no tenderness or lesion on the right labia. There is no tenderness or lesion on the left labia. Uterus is not deviated, not enlarged and not tender. Cervix exhibits no motion tenderness, no discharge and no friability. Right adnexum displays no mass, no tenderness and no fullness. Left adnexum displays no mass, no tenderness and no fullness. No tenderness or bleeding in the vagina. No vaginal discharge found.  Neurological: She is alert and oriented to person, place, and time. She has normal reflexes.  Skin: Skin is warm and dry. No rash noted.  Psychiatric: She has a normal mood and affect. Her behavior is normal. Judgment and thought content normal.    Results for orders placed or performed in visit on 02/28/18  CBC with Differential/Platelet  Result Value Ref Range   WBC 7.5 3.4 - 10.8 x10E3/uL   RBC 4.47 3.77 - 5.28 x10E6/uL   Hemoglobin 14.6 11.1 - 15.9 g/dL   Hematocrit 44.1 34.0 - 46.6 %   MCV 99 (H) 79 - 97 fL   MCH 32.7 26.6 - 33.0 pg   MCHC 33.1 31.5 - 35.7 g/dL   RDW 13.5 12.3 - 15.4 %   Platelets 255 150 - 450 x10E3/uL   Neutrophils 48 Not Estab. %   Lymphs 45 Not Estab. %   Monocytes 6 Not Estab. %   Eos 1 Not Estab. %   Basos 0 Not Estab. %   Neutrophils Absolute 3.5 1.4 - 7.0 x10E3/uL   Lymphocytes Absolute 3.3 (H) 0.7 - 3.1 x10E3/uL   Monocytes Absolute 0.5 0.1 - 0.9 x10E3/uL   EOS (ABSOLUTE) 0.1 0.0 - 0.4 x10E3/uL   Basophils Absolute 0.0 0.0 - 0.2 x10E3/uL   Immature Granulocytes 0 Not Estab. %   Immature Grans (Abs) 0.0 0.0 - 0.1 x10E3/uL  CMP14+EGFR  Result Value Ref Range   Glucose 78 65 - 99 mg/dL   BUN 20 6 - 24 mg/dL   Creatinine, Ser 0.57 0.57 - 1.00 mg/dL   GFR calc non Af Amer 103 >59 mL/min/1.73   GFR calc Af Amer 118 >59 mL/min/1.73    BUN/Creatinine Ratio 35 (H) 9 - 23   Sodium 138 134 - 144 mmol/L   Potassium 4.3 3.5 - 5.2 mmol/L   Chloride 97 96 - 106 mmol/L   CO2 23 20 - 29 mmol/L   Calcium 9.8 8.7 - 10.2 mg/dL   Total Protein 7.8 6.0 - 8.5 g/dL   Albumin 4.6 3.5 - 5.5 g/dL   Globulin, Total 3.2 1.5 - 4.5 g/dL   Albumin/Globulin Ratio 1.4 1.2 - 2.2  Bilirubin Total 0.4 0.0 - 1.2 mg/dL   Alkaline Phosphatase 80 39 - 117 IU/L   AST 27 0 - 40 IU/L   ALT 35 (H) 0 - 32 IU/L  Lipid panel  Result Value Ref Range   Cholesterol, Total 307 (H) 100 - 199 mg/dL   Triglycerides 132 0 - 149 mg/dL   HDL 70 >39 mg/dL   VLDL Cholesterol Cal 26 5 - 40 mg/dL   LDL Calculated 211 (H) 0 - 99 mg/dL   Chol/HDL Ratio 4.4 0.0 - 4.4 ratio  Thyroid Panel With TSH  Result Value Ref Range   TSH 1.210 0.450 - 4.500 uIU/mL   T4, Total 9.3 4.5 - 12.0 ug/dL   T3 Uptake Ratio 30 24 - 39 %   Free Thyroxine Index 2.8 1.2 - 4.9      Assessment & Plan:   1. Well female exam with routine gynecological exam - Pap IG w/ reflex to HPV when ASC-U - CBC with Differential/Platelet - CMP14+EGFR - Lipid panel - Thyroid Panel With TSH - gabapentin (NEURONTIN) 600 MG tablet; Take 1 tablet (600 mg total) by mouth at bedtime.  Dispense: 30 tablet; Refill: 11  2. Hypothyroidism, unspecified type - Thyroid Panel With TSH - levothyroxine (SYNTHROID, LEVOTHROID) 50 MCG tablet; Take 1 tablet (50 mcg total) by mouth daily.  Dispense: 30 tablet; Refill: 11  3. Papillary carcinoma, follicular variant ([facility]) Labs   4. Eczema, unspecified type Temovate cream   Continue all other maintenance medications as listed above.  Follow up plan: No follow-ups on file.  Educational handout given for [place] PA-C [place] [address]  [place], [place] [address] [phone]   03/02/2018, 5:27 PM

## 2018-03-05 ENCOUNTER — Telehealth: Payer: Self-pay | Admitting: Physician Assistant

## 2018-03-05 LAB — PAP IG W/ RFLX HPV ASCU: PAP Smear Comment: 0

## 2018-03-05 NOTE — Telephone Encounter (Signed)
Patient notified of lab results

## 2018-03-22 ENCOUNTER — Other Ambulatory Visit: Payer: Self-pay | Admitting: Physician Assistant

## 2018-09-20 ENCOUNTER — Other Ambulatory Visit: Payer: Self-pay | Admitting: Physician Assistant

## 2018-09-22 NOTE — Telephone Encounter (Signed)
Last seen 02/28/18  Pamela Santiago

## 2019-03-13 ENCOUNTER — Other Ambulatory Visit (HOSPITAL_COMMUNITY): Payer: Self-pay | Admitting: Physician Assistant

## 2019-03-13 ENCOUNTER — Telehealth: Payer: Self-pay | Admitting: Physician Assistant

## 2019-03-13 DIAGNOSIS — Z1231 Encounter for screening mammogram for malignant neoplasm of breast: Secondary | ICD-10-CM

## 2019-03-13 NOTE — Telephone Encounter (Signed)
Patient has a follow up telephone appointment scheduled. 

## 2019-03-17 ENCOUNTER — Other Ambulatory Visit: Payer: Self-pay

## 2019-03-17 ENCOUNTER — Ambulatory Visit (INDEPENDENT_AMBULATORY_CARE_PROVIDER_SITE_OTHER): Payer: BC Managed Care – PPO | Admitting: Physician Assistant

## 2019-03-17 ENCOUNTER — Encounter: Payer: Self-pay | Admitting: Physician Assistant

## 2019-03-17 DIAGNOSIS — F419 Anxiety disorder, unspecified: Secondary | ICD-10-CM | POA: Diagnosis not present

## 2019-03-17 DIAGNOSIS — E039 Hypothyroidism, unspecified: Secondary | ICD-10-CM | POA: Diagnosis not present

## 2019-03-17 DIAGNOSIS — I10 Essential (primary) hypertension: Secondary | ICD-10-CM

## 2019-03-17 DIAGNOSIS — Z0000 Encounter for general adult medical examination without abnormal findings: Secondary | ICD-10-CM

## 2019-03-17 MED ORDER — LEVOTHYROXINE SODIUM 50 MCG PO TABS: 50.0000 ug | ORAL_TABLET | Freq: Every day | ORAL | 11 refills | Status: DC

## 2019-03-17 MED ORDER — LISINOPRIL-HYDROCHLOROTHIAZIDE 20-12.5 MG PO TABS: 1.0000 | ORAL_TABLET | Freq: Every day | ORAL | 11 refills | Status: DC

## 2019-03-17 MED ORDER — GABAPENTIN 600 MG PO TABS: 600.0000 mg | ORAL_TABLET | Freq: Every day | ORAL | 11 refills | Status: DC

## 2019-03-17 MED ORDER — CLOBETASOL PROPIONATE 0.05 % EX CREA: 1.0000 "application " | TOPICAL_CREAM | Freq: Two times a day (BID) | CUTANEOUS | 11 refills | Status: DC

## 2019-03-17 MED ORDER — DIAZEPAM 5 MG PO TABS: 5.0000 mg | ORAL_TABLET | Freq: Four times a day (QID) | ORAL | 1 refills | Status: DC | PRN

## 2019-03-17 NOTE — Progress Notes (Signed)
Telephone visit  Subjective: YI:RSWNIOE chronic conditions PCP: Terald Sleeper, PA-C VOJ:[id number] Pamela Santiago is a 58 y.o. female calls for telephone consult today. Patient provides verbal consent for consult held via phone.  Patient is identified with 2 separate identifiers.  At this time the entire area is on COVID-19 social distancing and stay home orders are in place.  Patient is of higher risk and therefore we are performing this by a virtual method.  Location of patient: home Location of provider: HOME Others present for call: no  This is for review of the patient's chronic medical conditions and refills on medications.  Is been once a year since she had refills performed.  She will also come and have labs performed soon as possible.  She will have Pap smear performed later this summer.  She only needs a manual exam, she does not have a Pap collected anymore.  She has a mammogram appointment on July 1.  She states that overall she is doing well.  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   ROS: Per HPI  Allergies  Allergen Reactions  . Penicillins Hives   Past Medical History:  Diagnosis Date  . Allergy   . GERD (gastroesophageal reflux disease)   . Hypertension   . Thyroid disease     Current Outpatient Medications:  .  Biotin 10000 MCG TABS, Take 1 tablet by mouth daily., Disp: , Rfl:  .  Calcium Carbonate-Vitamin D (CALTRATE 600+D) 600-400 MG-UNIT tablet, Take 1 tablet by mouth daily., Disp: , Rfl:  .  cetirizine (ZYRTEC) 10 MG tablet, Take 10 mg by mouth daily., Disp: , Rfl:  .  cholecalciferol (VITAMIN D) 400 units TABS tablet, Take 400 Units by mouth daily., Disp: , Rfl:  .  clobetasol cream (TEMOVATE) 8.29 %, Apply 1 application topically 2 (two) times daily., Disp: 60 g, Rfl: 11 .  diazepam (VALIUM) 5 MG tablet, Take 1 tablet (5 mg total) by mouth every 6 (six) hours as needed for  anxiety., Disp: 40 tablet, Rfl: 1 .  gabapentin (NEURONTIN) 600 MG tablet, Take 1 tablet (600 mg total) by mouth at bedtime., Disp: 30 tablet, Rfl: 11 .  levothyroxine (SYNTHROID) 50 MCG tablet, Take 1 tablet (50 mcg total) by mouth daily., Disp: 30 tablet, Rfl: 11 .  lisinopril-hydrochlorothiazide (ZESTORETIC) 20-12.5 MG tablet, Take 1 tablet by mouth daily., Disp: 30 tablet, Rfl: 11 .  Omega-3 Fatty Acids (FISH OIL PEARLS) 300 MG CAPS, Take 2 capsules by mouth daily., Disp: , Rfl:  .  omeprazole (PRILOSEC) 20 MG capsule, Take 20 mg by mouth daily., Disp: , Rfl:   Assessment/ Plan: 58 y.o. female   1. Well adult - gabapentin (NEURONTIN) 600 MG tablet; Take 1 tablet (600 mg total) by mouth at bedtime.  Dispense: 30 tablet; Refill: 11 - CMP14+EGFR; Future - CBC with Differential/Platelet; Future - Lipid panel; Future - Thyroid Panel With TSH; Future  2. Hypothyroidism, unspecified type - levothyroxine (SYNTHROID) 50 MCG tablet; Take 1 tablet (50 mcg total) by mouth daily.  Dispense: 30 tablet; Refill: 11 - Thyroid Panel With TSH; Future  3. Essential hypertension - lisinopril-hydrochlorothiazide (ZESTORETIC) 20-12.5 MG tablet; Take 1 tablet by mouth daily.  Dispense: 30 tablet; Refill: 11 - CMP14+EGFR; Future - CBC with Differential/Platelet; Future - Lipid panel; Future - Thyroid Panel With TSH; Future  4. Anxiety - diazepam (VALIUM) 5 MG tablet;  Take 1 tablet (5 mg total) by mouth every 6 (six) hours as needed for anxiety.  Dispense: 40 tablet; Refill: 1   Continue all other maintenance medications as listed above.  Start time: 10:36 AM End time: 10:43 AM  Meds ordered this encounter  Medications  . diazepam (VALIUM) 5 MG tablet    Sig: Take 1 tablet (5 mg total) by mouth every 6 (six) hours as needed for anxiety.    Dispense:  40 tablet    Refill:  1    Order Specific Question:   Supervising Provider    Answer:   Janora Norlander [[id number]]  . gabapentin (NEURONTIN)  600 MG tablet    Sig: Take 1 tablet (600 mg total) by mouth at bedtime.    Dispense:  30 tablet    Refill:  11    Order Specific Question:   Supervising Provider    Answer:   Janora Norlander [[id number]]  . levothyroxine (SYNTHROID) 50 MCG tablet    Sig: Take 1 tablet (50 mcg total) by mouth daily.    Dispense:  30 tablet    Refill:  11    Order Specific Question:   Supervising Provider    Answer:   Janora Norlander [[id number]]  . lisinopril-hydrochlorothiazide (ZESTORETIC) 20-12.5 MG tablet    Sig: Take 1 tablet by mouth daily.    Dispense:  30 tablet    Refill:  11    Order Specific Question:   Supervising Provider    Answer:   Janora Norlander [[id number]]  . clobetasol cream (TEMOVATE) 0.05 %    Sig: Apply 1 application topically 2 (two) times daily.    Dispense:  60 g    Refill:  11    Order Specific Question:   Supervising Provider    Answer:   Janora Norlander [[id number]]    [name] PA-C [place] [phone]

## 2019-03-25 ENCOUNTER — Ambulatory Visit (HOSPITAL_COMMUNITY)
Admission: RE | Admit: 2019-03-25 | Discharge: 2019-03-25 | Disposition: A | Discharge status: Home / Self Care | Payer: BC Managed Care – PPO | Source: Ambulatory Visit | Attending: Physician Assistant | Admitting: Physician Assistant

## 2019-03-25 ENCOUNTER — Other Ambulatory Visit: Payer: BC Managed Care – PPO

## 2019-03-25 ENCOUNTER — Other Ambulatory Visit: Payer: Self-pay

## 2019-03-25 DIAGNOSIS — E039 Hypothyroidism, unspecified: Secondary | ICD-10-CM | POA: Diagnosis not present

## 2019-03-25 DIAGNOSIS — I10 Essential (primary) hypertension: Secondary | ICD-10-CM

## 2019-03-25 DIAGNOSIS — Z0000 Encounter for general adult medical examination without abnormal findings: Secondary | ICD-10-CM

## 2019-03-25 DIAGNOSIS — Z1231 Encounter for screening mammogram for malignant neoplasm of breast: Secondary | ICD-10-CM | POA: Insufficient documentation

## 2019-03-26 LAB — CMP14+EGFR
ALT: 29 IU/L (ref 0–32)
AST: 22 IU/L (ref 0–40)
Albumin/Globulin Ratio: 1.5 (ref 1.2–2.2)
Albumin: 4.3 g/dL (ref 3.8–4.9)
Alkaline Phosphatase: 77 IU/L (ref 39–117)
BUN/Creatinine Ratio: 28 — ABNORMAL HIGH (ref 9–23)
BUN: 16 mg/dL (ref 6–24)
Bilirubin Total: 0.4 mg/dL (ref 0.0–1.2)
CO2: 23 mmol/L (ref 20–29)
Calcium: 9.6 mg/dL (ref 8.7–10.2)
Chloride: 96 mmol/L (ref 96–106)
Creatinine, Ser: 0.57 mg/dL (ref 0.57–1.00)
GFR calc Af Amer: 117 mL/min/{1.73_m2} (ref >59)
GFR calc non Af Amer: 102 mL/min/{1.73_m2} (ref >59)
Globulin, Total: 2.8 g/dL (ref 1.5–4.5)
Glucose: 93 mg/dL (ref 65–99)
Potassium: 4.1 mmol/L (ref 3.5–5.2)
Sodium: 138 mmol/L (ref 134–144)
Total Protein: 7.1 g/dL (ref 6.0–8.5)

## 2019-03-26 LAB — CBC WITH DIFFERENTIAL/PLATELET
Basophils Absolute: 0 10*3/uL (ref 0.0–0.2)
Basos: 0 %
EOS (ABSOLUTE): 0.1 10*3/uL (ref 0.0–0.4)
Eos: 1 %
Hematocrit: 40.7 % (ref 34.0–46.6)
Hemoglobin: 13.7 g/dL (ref 11.1–15.9)
Immature Grans (Abs): 0 10*3/uL (ref 0.0–0.1)
Immature Granulocytes: 0 %
Lymphocytes Absolute: 2.4 10*3/uL (ref 0.7–3.1)
Lymphs: 33 %
MCH: 31.1 pg (ref 26.6–33.0)
MCHC: 33.7 g/dL (ref 31.5–35.7)
MCV: 92 fL (ref 79–97)
Monocytes Absolute: 0.5 10*3/uL (ref 0.1–0.9)
Monocytes: 7 %
Neutrophils Absolute: 4.2 10*3/uL (ref 1.4–7.0)
Neutrophils: 59 %
Platelets: 262 10*3/uL (ref 150–450)
RBC: 4.41 x10E6/uL (ref 3.77–5.28)
RDW: 12.8 % (ref 11.7–15.4)
WBC: 7.1 10*3/uL (ref 3.4–10.8)

## 2019-03-26 LAB — THYROID PANEL WITH TSH
Free Thyroxine Index: 2.6 (ref 1.2–4.9)
T3 Uptake Ratio: 34 % (ref 24–39)
T4, Total: 7.7 ug/dL (ref 4.5–12.0)
TSH: 1.04 u[IU]/mL (ref 0.450–4.500)

## 2019-03-26 LAB — LIPID PANEL
Chol/HDL Ratio: 4 ratio (ref 0.0–4.4)
Cholesterol, Total: 244 mg/dL — ABNORMAL HIGH (ref 100–199)
HDL: 61 mg/dL (ref >39)
LDL Calculated: 166 mg/dL — ABNORMAL HIGH (ref 0–99)
Triglycerides: 85 mg/dL (ref 0–149)
VLDL Cholesterol Cal: 17 mg/dL (ref 5–40)

## 2019-05-12 ENCOUNTER — Encounter: Payer: BC Managed Care – PPO | Admitting: Physician Assistant

## 2019-06-02 DIAGNOSIS — Z20828 Contact with and (suspected) exposure to other viral communicable diseases: Secondary | ICD-10-CM | POA: Diagnosis not present

## 2019-06-08 ENCOUNTER — Other Ambulatory Visit: Payer: Self-pay

## 2019-06-09 ENCOUNTER — Ambulatory Visit (INDEPENDENT_AMBULATORY_CARE_PROVIDER_SITE_OTHER): Payer: BC Managed Care – PPO | Admitting: Physician Assistant

## 2019-06-09 ENCOUNTER — Other Ambulatory Visit: Payer: Self-pay

## 2019-06-09 ENCOUNTER — Encounter: Payer: Self-pay | Admitting: Physician Assistant

## 2019-06-09 VITALS — BP 125/72 | HR 65 | Temp 98.0°F | Ht 65.0 in | Wt 169.2 lb

## 2019-06-09 DIAGNOSIS — Z0000 Encounter for general adult medical examination without abnormal findings: Secondary | ICD-10-CM | POA: Diagnosis not present

## 2019-06-09 DIAGNOSIS — Z20828 Contact with and (suspected) exposure to other viral communicable diseases: Secondary | ICD-10-CM | POA: Diagnosis not present

## 2019-06-09 NOTE — Progress Notes (Signed)
BP 125/72   Pulse 65   Temp 98 F (36.7 C) (Temporal)   Ht 5' 5"  (1.651 m)   Wt 169 lb 3.2 oz (76.7 kg)   BMI 28.16 kg/m    Subjective:    Patient ID: Pamela Santiago, female    DOB: 1961-02-15, 58 y.o.   MRN: [id number]  HPI: Pamela Santiago is a 58 y.o. female presenting on 06/09/2019 for Annual Exam  This patient comes in for her annual exam.  She did have her labs performed during the summer and everything they look very good except her LDL cholesterol had increased to 166.  She has been working diligently on her diet.  And has even started working out in the last few weeks.  She is down almost 20 pounds by our record.  I congratulated her on these efforts and courage her to continue.  We can plan to recheck the cholesterol in a couple more months.  Also refills were performed during the summer so she is up-to-date on everything.  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  Past Medical History:  Diagnosis Date  . Allergy   . GERD (gastroesophageal reflux disease)   . Hypertension   . Thyroid disease    Relevant past medical, surgical, family and social history reviewed and updated as indicated. Interim medical history since our last visit reviewed. Allergies and medications reviewed and updated. DATA REVIEWED: CHART IN EPIC  Family History reviewed for pertinent findings.  Review of Systems  Constitutional: Negative.  Negative for activity change, fatigue and fever.  HENT: Negative.   Eyes: Negative.   Respiratory: Negative.  Negative for cough.   Cardiovascular: Negative.  Negative for chest pain.  Gastrointestinal: Negative.  Negative for abdominal pain.  Endocrine: Negative.   Genitourinary: Negative.  Negative for dysuria.  Musculoskeletal: Negative.   Skin: Negative.   Neurological: Negative.     Allergies as of 06/09/2019      Reactions   Penicillins Hives      Medication List       Accurate as of  June 09, 2019  9:18 AM. If you have any questions, ask your nurse or doctor.        Biotin 10000 MCG Tabs Take 1 tablet by mouth daily.   Caltrate 600+D 600-400 MG-UNIT tablet Generic drug: Calcium Carbonate-Vitamin D Take 1 tablet by mouth daily.   cetirizine 10 MG tablet Commonly known as: ZYRTEC Take 10 mg by mouth daily.   cholecalciferol 10 MCG (400 UNIT) Tabs tablet Commonly known as: VITAMIN D3 Take 400 Units by mouth daily.   clobetasol cream 0.05 % Commonly known as: TEMOVATE Apply 1 application topically 2 (two) times daily.   diazepam 5 MG tablet Commonly known as: VALIUM Take 1 tablet (5 mg total) by mouth every 6 (six) hours as needed for anxiety.   Fish Oil Pearls 300 MG Caps Take 2 capsules by mouth daily.   gabapentin 600 MG tablet Commonly known as: NEURONTIN Take 1 tablet (600 mg total) by mouth at bedtime.   levothyroxine 50 MCG tablet Commonly known as: SYNTHROID Take 1 tablet (50 mcg total) by mouth daily.   lisinopril-hydrochlorothiazide 20-12.5 MG tablet Commonly known as: ZESTORETIC Take 1 tablet by mouth daily.   omeprazole 20 MG capsule Commonly known as: PRILOSEC Take 20 mg by mouth daily.          Objective:    BP 125/72  Pulse 65   Temp 98 F (36.7 C) (Temporal)   Ht 5' 5"  (1.651 m)   Wt 169 lb 3.2 oz (76.7 kg)   BMI 28.16 kg/m   Allergies  Allergen Reactions  . Penicillins Hives    Wt Readings from Last 3 Encounters:  06/09/19 169 lb 3.2 oz (76.7 kg)  02/28/18 187 lb (84.8 kg)  02/05/17 187 lb 9.6 oz (85.1 kg)    Physical Exam Constitutional:      Appearance: She is well-developed.  HENT:     Head: Normocephalic and atraumatic.  Eyes:     Conjunctiva/sclera: Conjunctivae normal.     Pupils: Pupils are equal, round, and reactive to light.  Neck:     Musculoskeletal: Normal range of motion and neck supple.  Cardiovascular:     Rate and Rhythm: Normal rate and regular rhythm.     Heart sounds: Normal  heart sounds.  Pulmonary:     Effort: Pulmonary effort is normal.     Breath sounds: Normal breath sounds.  Chest:     Breasts: Breasts are symmetrical.        Right: No mass, skin change or tenderness.        Left: No mass, skin change or tenderness.  Abdominal:     General: Bowel sounds are normal.     Palpations: Abdomen is soft.  Genitourinary:    General: Normal vulva.     Labia:        Right: No tenderness or lesion.        Left: No tenderness or lesion.      Vagina: Normal. No vaginal discharge, tenderness or bleeding.     Uterus: Not deviated, not enlarged and not tender.      Adnexa:        Right: No mass, tenderness or fullness.         Left: No mass, tenderness or fullness.       Rectum: No anal fissure.  Skin:    General: Skin is warm and dry.     Findings: No rash.  Neurological:     Mental Status: She is alert and oriented to person, place, and time.     Deep Tendon Reflexes: Reflexes are normal and symmetric.  Psychiatric:        Behavior: Behavior normal.        Thought Content: Thought content normal.        Judgment: Judgment normal.     Results for orders placed or performed in visit on 03/25/19  Thyroid Panel With TSH  Result Value Ref Range   TSH 1.040 0.450 - 4.500 uIU/mL   T4, Total 7.7 4.5 - 12.0 ug/dL   T3 Uptake Ratio 34 24 - 39 %   Free Thyroxine Index 2.6 1.2 - 4.9  Lipid panel  Result Value Ref Range   Cholesterol, Total 244 (H) 100 - 199 mg/dL   Triglycerides 85 0 - 149 mg/dL   HDL 61 >39 mg/dL   VLDL Cholesterol Cal 17 5 - 40 mg/dL   LDL Calculated 166 (H) 0 - 99 mg/dL   Chol/HDL Ratio 4.0 0.0 - 4.4 ratio  CBC with Differential/Platelet  Result Value Ref Range   WBC 7.1 3.4 - 10.8 x10E3/uL   RBC 4.41 3.77 - 5.28 x10E6/uL   Hemoglobin 13.7 11.1 - 15.9 g/dL   Hematocrit 40.7 34.0 - 46.6 %   MCV 92 79 - 97 fL   MCH 31.1 26.6 - 33.0 pg  MCHC 33.7 31.5 - 35.7 g/dL   RDW 12.8 11.7 - 15.4 %   Platelets 262 150 - 450 x10E3/uL    Neutrophils 59 Not Estab. %   Lymphs 33 Not Estab. %   Monocytes 7 Not Estab. %   Eos 1 Not Estab. %   Basos 0 Not Estab. %   Neutrophils Absolute 4.2 1.4 - 7.0 x10E3/uL   Lymphocytes Absolute 2.4 0.7 - 3.1 x10E3/uL   Monocytes Absolute 0.5 0.1 - 0.9 x10E3/uL   EOS (ABSOLUTE) 0.1 0.0 - 0.4 x10E3/uL   Basophils Absolute 0.0 0.0 - 0.2 x10E3/uL   Immature Granulocytes 0 Not Estab. %   Immature Grans (Abs) 0.0 0.0 - 0.1 x10E3/uL  CMP14+EGFR  Result Value Ref Range   Glucose 93 65 - 99 mg/dL   BUN 16 6 - 24 mg/dL   Creatinine, Ser 0.57 0.57 - 1.00 mg/dL   GFR calc non Af Amer 102 >59 mL/min/1.73   GFR calc Af Amer 117 >59 mL/min/1.73   BUN/Creatinine Ratio 28 (H) 9 - 23   Sodium 138 134 - 144 mmol/L   Potassium 4.1 3.5 - 5.2 mmol/L   Chloride 96 96 - 106 mmol/L   CO2 23 20 - 29 mmol/L   Calcium 9.6 8.7 - 10.2 mg/dL   Total Protein 7.1 6.0 - 8.5 g/dL   Albumin 4.3 3.8 - 4.9 g/dL   Globulin, Total 2.8 1.5 - 4.5 g/dL   Albumin/Globulin Ratio 1.5 1.2 - 2.2   Bilirubin Total 0.4 0.0 - 1.2 mg/dL   Alkaline Phosphatase 77 39 - 117 IU/L   AST 22 0 - 40 IU/L   ALT 29 0 - 32 IU/L      Assessment & Plan:   1. Well adult exam Continue weight loss efforts, recheck LIPID panel in a few months Successful in weight loss, pressure reduction   Continue all other maintenance medications as listed above.  Follow up plan: Return in about 1 year (around 06/08/2020).  Educational handout given for health maintenance  Terald Sleeper PA-C [facility] [address]  [place], [place] [address] [phone]   06/09/2019, 9:18 AM

## 2019-06-09 NOTE — Patient Instructions (Signed)
Health Maintenance, Female Adopting a healthy lifestyle and getting preventive care are important in promoting health and wellness. Ask your health care provider about:  The right schedule for you to have regular tests and exams.  Things you can do on your own to prevent diseases and keep yourself healthy. What should I know about diet, weight, and exercise? Eat a healthy diet   Eat a diet that includes plenty of vegetables, fruits, low-fat dairy products, and lean protein.  Do not eat a lot of foods that are high in solid fats, added sugars, or sodium. Maintain a healthy weight Body mass index (BMI) is used to identify weight problems. It estimates body fat based on height and weight. Your health care provider can help determine your BMI and help you achieve or maintain a healthy weight. Get regular exercise Get regular exercise. This is one of the most important things you can do for your health. Most adults should:  Exercise for at least 150 minutes each week. The exercise should increase your heart rate and make you sweat (moderate-intensity exercise).  Do strengthening exercises at least twice a week. This is in addition to the moderate-intensity exercise.  Spend less time sitting. Even light physical activity can be beneficial. Watch cholesterol and blood lipids Have your blood tested for lipids and cholesterol at 60 years of age, then have this test every 5 years. Have your cholesterol levels checked more often if:  Your lipid or cholesterol levels are high.  You are older than 60 years of age.  You are at high risk for heart disease. What should I know about cancer screening? Depending on your health history and family history, you may need to have cancer screening at various ages. This may include screening for:  Breast cancer.  Cervical cancer.  Colorectal cancer.  Skin cancer.  Lung cancer. What should I know about heart disease, diabetes, and high blood  pressure? Blood pressure and heart disease  High blood pressure causes heart disease and increases the risk of stroke. This is more likely to develop in people who have high blood pressure readings, are of African descent, or are overweight.  Have your blood pressure checked: ? Every 3-5 years if you are 18-39 years of age. ? Every year if you are 40 years old or older. Diabetes Have regular diabetes screenings. This checks your fasting blood sugar level. Have the screening done:  Once every three years after age 40 if you are at a normal weight and have a low risk for diabetes.  More often and at a younger age if you are overweight or have a high risk for diabetes. What should I know about preventing infection? Hepatitis B If you have a higher risk for hepatitis B, you should be screened for this virus. Talk with your health care provider to find out if you are at risk for hepatitis B infection. Hepatitis C Testing is recommended for:  Everyone born from 1945 through 1965.  Anyone with known risk factors for hepatitis C. Sexually transmitted infections (STIs)  Get screened for STIs, including gonorrhea and chlamydia, if: ? You are sexually active and are younger than 60 years of age. ? You are older than 60 years of age and your health care provider tells you that you are at risk for this type of infection. ? Your sexual activity has changed since you were last screened, and you are at increased risk for chlamydia or gonorrhea. Ask your health care provider if   you are at risk.  Ask your health care provider about whether you are at high risk for HIV. Your health care provider may recommend a prescription medicine to help prevent HIV infection. If you choose to take medicine to prevent HIV, you should first get tested for HIV. You should then be tested every 3 months for as long as you are taking the medicine. Pregnancy  If you are about to stop having your period (premenopausal) and  you may become pregnant, seek counseling before you get pregnant.  Take 400 to 800 micrograms (mcg) of folic acid every day if you become pregnant.  Ask for birth control (contraception) if you want to prevent pregnancy. Osteoporosis and menopause Osteoporosis is a disease in which the bones lose minerals and strength with aging. This can result in bone fractures. If you are 65 years old or older, or if you are at risk for osteoporosis and fractures, ask your health care provider if you should:  Be screened for bone loss.  Take a calcium or vitamin D supplement to lower your risk of fractures.  Be given hormone replacement therapy (HRT) to treat symptoms of menopause. Follow these instructions at home: Lifestyle  Do not use any products that contain nicotine or tobacco, such as cigarettes, e-cigarettes, and chewing tobacco. If you need help quitting, ask your health care provider.  Do not use street drugs.  Do not share needles.  Ask your health care provider for help if you need support or information about quitting drugs. Alcohol use  Do not drink alcohol if: ? Your health care provider tells you not to drink. ? You are pregnant, may be pregnant, or are planning to become pregnant.  If you drink alcohol: ? Limit how much you use to 0-1 drink a day. ? Limit intake if you are breastfeeding.  Be aware of how much alcohol is in your drink. In the [place] one drink equals one 12 oz bottle of beer (355 mL), one 5 oz glass of wine (148 mL), or one 1 oz glass of hard liquor (44 mL). General instructions  Schedule regular health, dental, and eye exams.  Stay current with your vaccines.  Tell your health care provider if: ? You often feel depressed. ? You have ever been abused or do not feel safe at home. Summary  Adopting a healthy lifestyle and getting preventive care are important in promoting health and wellness.  Follow your health care provider's instructions about healthy  diet, exercising, and getting tested or screened for diseases.  Follow your health care provider's instructions on monitoring your cholesterol and blood pressure. This information is not intended to replace advice given to you by your health care provider. Make sure you discuss any questions you have with your health care provider. Document Released: 03/26/2011 Document Revised: 09/03/2018 Document Reviewed: 09/03/2018 Elsevier Patient Education  2020 [employer]  

## 2019-06-15 DIAGNOSIS — Z20828 Contact with and (suspected) exposure to other viral communicable diseases: Secondary | ICD-10-CM | POA: Diagnosis not present

## 2019-06-22 DIAGNOSIS — Z20828 Contact with and (suspected) exposure to other viral communicable diseases: Secondary | ICD-10-CM | POA: Diagnosis not present

## 2019-06-29 DIAGNOSIS — Z20828 Contact with and (suspected) exposure to other viral communicable diseases: Secondary | ICD-10-CM | POA: Diagnosis not present

## 2019-07-06 DIAGNOSIS — Z20828 Contact with and (suspected) exposure to other viral communicable diseases: Secondary | ICD-10-CM | POA: Diagnosis not present

## 2019-07-13 DIAGNOSIS — Z20828 Contact with and (suspected) exposure to other viral communicable diseases: Secondary | ICD-10-CM | POA: Diagnosis not present

## 2019-07-20 DIAGNOSIS — Z20828 Contact with and (suspected) exposure to other viral communicable diseases: Secondary | ICD-10-CM | POA: Diagnosis not present

## 2019-08-03 DIAGNOSIS — Z20828 Contact with and (suspected) exposure to other viral communicable diseases: Secondary | ICD-10-CM | POA: Diagnosis not present

## 2020-01-15 ENCOUNTER — Other Ambulatory Visit: Payer: Self-pay | Admitting: *Deleted

## 2020-01-15 DIAGNOSIS — F419 Anxiety disorder, unspecified: Secondary | ICD-10-CM

## 2020-01-19 ENCOUNTER — Other Ambulatory Visit: Payer: Self-pay | Admitting: *Deleted

## 2020-01-19 DIAGNOSIS — F419 Anxiety disorder, unspecified: Secondary | ICD-10-CM

## 2020-01-22 ENCOUNTER — Telehealth: Payer: Self-pay | Admitting: Physician Assistant

## 2020-01-22 NOTE — Telephone Encounter (Signed)
First available later afternoon appointment with Pamela Santiago was on 02/08/2020, patient did not want to wait that long to be seen.  An appointment was scheduled at 11:55 am on 01/27/2020.

## 2020-01-27 ENCOUNTER — Other Ambulatory Visit: Payer: Self-pay

## 2020-01-27 ENCOUNTER — Ambulatory Visit: Payer: BC Managed Care – PPO | Admitting: Family

## 2020-01-27 ENCOUNTER — Encounter: Payer: Self-pay | Admitting: Family

## 2020-01-27 VITALS — BP 138/81 | HR 83 | Temp 98.0°F | Ht 65.0 in | Wt 160.0 lb

## 2020-01-27 DIAGNOSIS — Z0001 Encounter for general adult medical examination with abnormal findings: Secondary | ICD-10-CM

## 2020-01-27 DIAGNOSIS — I10 Essential (primary) hypertension: Secondary | ICD-10-CM

## 2020-01-27 DIAGNOSIS — F419 Anxiety disorder, unspecified: Secondary | ICD-10-CM

## 2020-01-27 DIAGNOSIS — Z0000 Encounter for general adult medical examination without abnormal findings: Secondary | ICD-10-CM

## 2020-01-27 DIAGNOSIS — E039 Hypothyroidism, unspecified: Secondary | ICD-10-CM

## 2020-01-27 DIAGNOSIS — K219 Gastro-esophageal reflux disease without esophagitis: Secondary | ICD-10-CM

## 2020-01-27 DIAGNOSIS — Z1159 Encounter for screening for other viral diseases: Secondary | ICD-10-CM

## 2020-01-27 DIAGNOSIS — Z79891 Long term (current) use of opiate analgesic: Secondary | ICD-10-CM | POA: Diagnosis not present

## 2020-01-27 DIAGNOSIS — Z79899 Other long term (current) drug therapy: Secondary | ICD-10-CM | POA: Insufficient documentation

## 2020-01-27 DIAGNOSIS — R232 Flushing: Secondary | ICD-10-CM

## 2020-01-27 MED ORDER — DIAZEPAM 5 MG PO TABS: 5.0000 mg | ORAL_TABLET | Freq: Two times a day (BID) | ORAL | 3 refills | Status: DC | PRN

## 2020-01-27 NOTE — Patient Instructions (Signed)
Health Maintenance, Female Adopting a healthy lifestyle and getting preventive care are important in promoting health and wellness. Ask your health care provider about:  The right schedule for you to have regular tests and exams.  Things you can do on your own to prevent diseases and keep yourself healthy. What should I know about diet, weight, and exercise? Eat a healthy diet   Eat a diet that includes plenty of vegetables, fruits, low-fat dairy products, and lean protein.  Do not eat a lot of foods that are high in solid fats, added sugars, or sodium. Maintain a healthy weight Body mass index (BMI) is used to identify weight problems. It estimates body fat based on height and weight. Your health care provider can help determine your BMI and help you achieve or maintain a healthy weight. Get regular exercise Get regular exercise. This is one of the most important things you can do for your health. Most adults should:  Exercise for at least 150 minutes each week. The exercise should increase your heart rate and make you sweat (moderate-intensity exercise).  Do strengthening exercises at least twice a week. This is in addition to the moderate-intensity exercise.  Spend less time sitting. Even light physical activity can be beneficial. Watch cholesterol and blood lipids Have your blood tested for lipids and cholesterol at 61 years of age, then have this test every 5 years. Have your cholesterol levels checked more often if:  Your lipid or cholesterol levels are high.  You are older than 61 years of age.  You are at high risk for heart disease. What should I know about cancer screening? Depending on your health history and family history, you may need to have cancer screening at various ages. This may include screening for:  Breast cancer.  Cervical cancer.  Colorectal cancer.  Skin cancer.  Lung cancer. What should I know about heart disease, diabetes, and high blood  pressure? Blood pressure and heart disease  High blood pressure causes heart disease and increases the risk of stroke. This is more likely to develop in people who have high blood pressure readings, are of African descent, or are overweight.  Have your blood pressure checked: ? Every 3-5 years if you are 18-39 years of age. ? Every year if you are 40 years old or older. Diabetes Have regular diabetes screenings. This checks your fasting blood sugar level. Have the screening done:  Once every three years after age 40 if you are at a normal weight and have a low risk for diabetes.  More often and at a younger age if you are overweight or have a high risk for diabetes. What should I know about preventing infection? Hepatitis B If you have a higher risk for hepatitis B, you should be screened for this virus. Talk with your health care provider to find out if you are at risk for hepatitis B infection. Hepatitis C Testing is recommended for:  Everyone born from 1945 through 1965.  Anyone with known risk factors for hepatitis C. Sexually transmitted infections (STIs)  Get screened for STIs, including gonorrhea and chlamydia, if: ? You are sexually active and are younger than 61 years of age. ? You are older than 61 years of age and your health care provider tells you that you are at risk for this type of infection. ? Your sexual activity has changed since you were last screened, and you are at increased risk for chlamydia or gonorrhea. Ask your health care provider if   you are at risk.  Ask your health care provider about whether you are at high risk for HIV. Your health care provider may recommend a prescription medicine to help prevent HIV infection. If you choose to take medicine to prevent HIV, you should first get tested for HIV. You should then be tested every 3 months for as long as you are taking the medicine. Pregnancy  If you are about to stop having your period (premenopausal) and  you may become pregnant, seek counseling before you get pregnant.  Take 400 to 800 micrograms (mcg) of folic acid every day if you become pregnant.  Ask for birth control (contraception) if you want to prevent pregnancy. Osteoporosis and menopause Osteoporosis is a disease in which the bones lose minerals and strength with aging. This can result in bone fractures. If you are 65 years old or older, or if you are at risk for osteoporosis and fractures, ask your health care provider if you should:  Be screened for bone loss.  Take a calcium or vitamin D supplement to lower your risk of fractures.  Be given hormone replacement therapy (HRT) to treat symptoms of menopause. Follow these instructions at home: Lifestyle  Do not use any products that contain nicotine or tobacco, such as cigarettes, e-cigarettes, and chewing tobacco. If you need help quitting, ask your health care provider.  Do not use street drugs.  Do not share needles.  Ask your health care provider for help if you need support or information about quitting drugs. Alcohol use  Do not drink alcohol if: ? Your health care provider tells you not to drink. ? You are pregnant, may be pregnant, or are planning to become pregnant.  If you drink alcohol: ? Limit how much you use to 0-1 drink a day. ? Limit intake if you are breastfeeding.  Be aware of how much alcohol is in your drink. In the [place] one drink equals one 12 oz bottle of beer (355 mL), one 5 oz glass of wine (148 mL), or one 1 oz glass of hard liquor (44 mL). General instructions  Schedule regular health, dental, and eye exams.  Stay current with your vaccines.  Tell your health care provider if: ? You often feel depressed. ? You have ever been abused or do not feel safe at home. Summary  Adopting a healthy lifestyle and getting preventive care are important in promoting health and wellness.  Follow your health care provider's instructions about healthy  diet, exercising, and getting tested or screened for diseases.  Follow your health care provider's instructions on monitoring your cholesterol and blood pressure. This information is not intended to replace advice given to you by your health care provider. Make sure you discuss any questions you have with your health care provider. Document Revised: 09/03/2018 Document Reviewed: 09/03/2018 Elsevier Patient Education  2020 Elsevier Inc.  

## 2020-01-27 NOTE — Progress Notes (Signed)
Subjective:    Patient ID: Pamela Santiago, female    DOB: Oct 25, 1960, 59 y.o.   MRN: [id number]  Chief Complaint  Patient presents with  . Medical Management of Chronic Issues    JONES   Pt presents to the office today to establish care with me and CPE without pap. She reports she has hot flashes and has been taking gabapentin 600 mg at bedtime that is helping.  Hypertension This is a chronic problem. The current episode started more than 1 year ago. The problem is controlled. Associated symptoms include anxiety. Pertinent negatives include no malaise/fatigue, peripheral edema or shortness of breath. Past treatments include diuretics and ACE inhibitors. The current treatment provides moderate improvement. Identifiable causes of hypertension include a thyroid problem.  Thyroid Problem Presents for follow-up visit. Symptoms include anxiety. Patient reports no constipation, depressed mood, diaphoresis or fatigue. The symptoms have been stable.  Anxiety Presents for follow-up visit. Symptoms include excessive worry, irritability and nervous/anxious behavior. Patient reports no decreased concentration, depressed mood or shortness of breath. Symptoms occur occasionally. The severity of symptoms is moderate.    Gastroesophageal Reflux She complains of belching and heartburn. This is a chronic problem. The current episode started more than 1 year ago. The problem occurs occasionally. The problem has been waxing and waning. Pertinent negatives include no fatigue. Risk factors include obesity. She has tried a PPI for the symptoms.      Review of Systems  Constitutional: Positive for irritability. Negative for diaphoresis, fatigue and malaise/fatigue.  Respiratory: Negative for shortness of breath.   Gastrointestinal: Positive for heartburn. Negative for constipation.  Psychiatric/Behavioral: Negative for decreased concentration. The patient is nervous/anxious.   All other systems reviewed and  are negative.  Family History  Problem Relation Age of Onset  . COPD Mother   . Stroke Father   . Seizures Sister   . Hypertension Sister    Social History   Socioeconomic History  . Marital status: Divorced    Spouse name: Not on file  . Number of children: Not on file  . Years of education: Not on file  . Highest education level: Not on file  Occupational History  . Not on file  Tobacco Use  . Smoking status: Former Smoker    Quit date: 02/05/2002    Years since quitting: 17.9  . Smokeless tobacco: Never Used  Substance and Sexual Activity  . Alcohol use: Yes    Comment: occasional  . Drug use: No  . Sexual activity: Not on file  Other Topics Concern  . Not on file  Social History Narrative  . Not on file   Social Determinants of Health   Financial Resource Strain:   . Difficulty of Paying Living Expenses:   Food Insecurity:   . Worried About Charity fundraiser in the Last Year:   . Arboriculturist in the Last Year:   Transportation Needs:   . Film/video editor (Medical):   Marland Kitchen Lack of Transportation (Non-Medical):   Physical Activity:   . Days of Exercise per Week:   . Minutes of Exercise per Session:   Stress:   . Feeling of Stress :   Social Connections:   . Frequency of Communication with Friends and Family:   . Frequency of Social Gatherings with Friends and Family:   . Attends Religious Services:   . Active Member of Clubs or Organizations:   . Attends Archivist Meetings:   Marland Kitchen Marital Status:  Objective:   Physical Exam Vitals reviewed.  Constitutional:      General: She is not in acute distress.    Appearance: She is well-developed.  HENT:     Head: Normocephalic and atraumatic.     Right Ear: Tympanic membrane normal.     Left Ear: Tympanic membrane normal.  Eyes:     Pupils: Pupils are equal, round, and reactive to light.  Neck:     Thyroid: No thyromegaly.  Cardiovascular:     Rate and Rhythm: Normal rate and  regular rhythm.     Heart sounds: Normal heart sounds. No murmur.  Pulmonary:     Effort: Pulmonary effort is normal. No respiratory distress.     Breath sounds: Normal breath sounds. No wheezing.  Abdominal:     General: Bowel sounds are normal. There is no distension.     Palpations: Abdomen is soft.     Tenderness: There is no abdominal tenderness.  Musculoskeletal:        General: No tenderness. Normal range of motion.     Cervical back: Normal range of motion and neck supple.  Skin:    General: Skin is warm and dry.  Neurological:     Mental Status: She is alert and oriented to person, place, and time.     Cranial Nerves: No cranial nerve deficit.     Deep Tendon Reflexes: Reflexes are normal and symmetric.  Psychiatric:        Behavior: Behavior normal.        Thought Content: Thought content normal.        Judgment: Judgment normal.       BP 138/81   Pulse 83   Temp 98 F (36.7 C) (Temporal)   Ht 5' 5"  (1.651 m)   Wt 160 lb (72.6 kg)   SpO2 96%   BMI 26.63 kg/m      Assessment & Plan:  SHERRILL BUIKEMA comes in today with chief complaint of Medical Management of Chronic Issues (JONES)   Diagnosis and orders addressed:  1. Anxiety - diazepam (VALIUM) 5 MG tablet; Take 1 tablet (5 mg total) by mouth every 12 (twelve) hours as needed for anxiety.  Dispense: 20 tablet; Refill: 3 - ToxASSURE Select 13 (MW), Urine - CMP14+EGFR; Future - CBC with Differential/Platelet; Future  2. Essential hypertension - CMP14+EGFR; Future - CBC with Differential/Platelet; Future  3. Hypothyroidism, unspecified typ - CMP14+EGFR; Future - CBC with Differential/Platelet; Future  4. Gastroesophageal reflux disease, unspecified whether esophagitis present - CMP14+EGFR; Future - CBC with Differential/Platelet; Future  5. Hot flash not due to menopause - CMP14+EGFR; Future - CBC with Differential/Platelet; Future  6. Annual physical exam - CMP14+EGFR; Future - CBC with  Differential/Platelet; Future - Lipid panel; Future - TSH; Future - Hepatitis C antibody; Future  7. Controlled substance agreement signed - diazepam (VALIUM) 5 MG tablet; Take 1 tablet (5 mg total) by mouth every 12 (twelve) hours as needed for anxiety.  Dispense: 20 tablet; Refill: 3 - ToxASSURE Select 13 (MW), Urine - CMP14+EGFR; Future - CBC with Differential/Platelet; Future  8. Need for hepatitis C screening test   Labs pending Pt reviewed in [place] controlled database- No red flags, has not had filled since 02/2019, contract and drug screen up dated today Health Maintenance reviewed Diet and exercise encouraged  Follow up plan: 1 year    Evelina Dun, FNP

## 2020-01-29 LAB — TOXASSURE SELECT 13 (MW), URINE

## 2020-03-07 ENCOUNTER — Other Ambulatory Visit: Payer: Self-pay

## 2020-03-07 ENCOUNTER — Other Ambulatory Visit: Payer: BC Managed Care – PPO

## 2020-03-07 DIAGNOSIS — E039 Hypothyroidism, unspecified: Secondary | ICD-10-CM

## 2020-03-07 DIAGNOSIS — I10 Essential (primary) hypertension: Secondary | ICD-10-CM | POA: Diagnosis not present

## 2020-03-07 DIAGNOSIS — F419 Anxiety disorder, unspecified: Secondary | ICD-10-CM | POA: Diagnosis not present

## 2020-03-07 DIAGNOSIS — Z79899 Other long term (current) drug therapy: Secondary | ICD-10-CM | POA: Diagnosis not present

## 2020-03-07 DIAGNOSIS — K219 Gastro-esophageal reflux disease without esophagitis: Secondary | ICD-10-CM

## 2020-03-07 DIAGNOSIS — Z0000 Encounter for general adult medical examination without abnormal findings: Secondary | ICD-10-CM | POA: Diagnosis not present

## 2020-03-07 DIAGNOSIS — R232 Flushing: Secondary | ICD-10-CM

## 2020-03-08 LAB — CMP14+EGFR
ALT: 44 IU/L — ABNORMAL HIGH (ref 0–32)
AST: 37 IU/L (ref 0–40)
Albumin/Globulin Ratio: 1.4 (ref 1.2–2.2)
Albumin: 4.2 g/dL (ref 3.8–4.9)
Alkaline Phosphatase: 88 IU/L (ref 48–121)
BUN/Creatinine Ratio: 40 — ABNORMAL HIGH (ref 12–28)
BUN: 21 mg/dL (ref 8–27)
Bilirubin Total: 0.4 mg/dL (ref 0.0–1.2)
CO2: 24 mmol/L (ref 20–29)
Calcium: 9.8 mg/dL (ref 8.7–10.3)
Chloride: 97 mmol/L (ref 96–106)
Creatinine, Ser: 0.53 mg/dL — ABNORMAL LOW (ref 0.57–1.00)
GFR calc Af Amer: 119 mL/min/{1.73_m2} (ref >59)
GFR calc non Af Amer: 104 mL/min/{1.73_m2} (ref >59)
Globulin, Total: 3 g/dL (ref 1.5–4.5)
Glucose: 89 mg/dL (ref 65–99)
Potassium: 5 mmol/L (ref 3.5–5.2)
Sodium: 141 mmol/L (ref 134–144)
Total Protein: 7.2 g/dL (ref 6.0–8.5)

## 2020-03-08 LAB — CBC WITH DIFFERENTIAL/PLATELET
Basophils Absolute: 0 10*3/uL (ref 0.0–0.2)
Basos: 1 %
EOS (ABSOLUTE): 0.1 10*3/uL (ref 0.0–0.4)
Eos: 1 %
Hematocrit: 42.5 % (ref 34.0–46.6)
Hemoglobin: 13.7 g/dL (ref 11.1–15.9)
Immature Grans (Abs): 0 10*3/uL (ref 0.0–0.1)
Immature Granulocytes: 0 %
Lymphocytes Absolute: 2.7 10*3/uL (ref 0.7–3.1)
Lymphs: 42 %
MCH: 31.4 pg (ref 26.6–33.0)
MCHC: 32.2 g/dL (ref 31.5–35.7)
MCV: 98 fL — ABNORMAL HIGH (ref 79–97)
Monocytes Absolute: 0.4 10*3/uL (ref 0.1–0.9)
Monocytes: 6 %
Neutrophils Absolute: 3.2 10*3/uL (ref 1.4–7.0)
Neutrophils: 50 %
Platelets: 257 10*3/uL (ref 150–450)
RBC: 4.36 x10E6/uL (ref 3.77–5.28)
RDW: 13 % (ref 11.7–15.4)
WBC: 6.4 10*3/uL (ref 3.4–10.8)

## 2020-03-08 LAB — LIPID PANEL
Chol/HDL Ratio: 3.6 ratio (ref 0.0–4.4)
Cholesterol, Total: 254 mg/dL — ABNORMAL HIGH (ref 100–199)
HDL: 71 mg/dL (ref >39)
LDL Chol Calc (NIH): 168 mg/dL — ABNORMAL HIGH (ref 0–99)
Triglycerides: 89 mg/dL (ref 0–149)
VLDL Cholesterol Cal: 15 mg/dL (ref 5–40)

## 2020-03-08 LAB — TSH: TSH: 1.31 u[IU]/mL (ref 0.450–4.500)

## 2020-03-08 LAB — HEPATITIS C ANTIBODY: Hep C Virus Ab: <0.1 s/co ratio (ref 0.0–0.9)

## 2020-03-21 ENCOUNTER — Other Ambulatory Visit (HOSPITAL_COMMUNITY): Payer: Self-pay | Admitting: Family

## 2020-03-21 DIAGNOSIS — Z1231 Encounter for screening mammogram for malignant neoplasm of breast: Secondary | ICD-10-CM

## 2020-03-29 ENCOUNTER — Telehealth: Payer: Self-pay

## 2020-03-29 DIAGNOSIS — Z0000 Encounter for general adult medical examination without abnormal findings: Secondary | ICD-10-CM

## 2020-03-29 MED ORDER — GABAPENTIN 600 MG PO TABS: 600.0000 mg | ORAL_TABLET | Freq: Every day | ORAL | 4 refills | Status: DC

## 2020-03-29 NOTE — Telephone Encounter (Signed)
Gabapentin Prescription sent to pharmacy   

## 2020-03-29 NOTE — Telephone Encounter (Signed)
Shelia from [facility] called stated that pt came in to pharmacy today requesting Lisinopril/HCTZ, Levothyroxine, and Gabapentin.  Pt did have a recent visit and labs with Christy. Instructed pharmacy to fill Lisinopril/HCTZ and Levothyroxine #90 with 3 refills. I will need to get approval for the Gabapentin since it was last refilled by [name] and Alyse Low did not mention the medication in her last note.  Christy,  Are you alright refilling Gabapentin. Pt last seen in June. Angel prescribed med 03/17/19 #30 with 11 refills at that time.

## 2020-10-07 ENCOUNTER — Other Ambulatory Visit: Payer: Self-pay

## 2020-10-07 ENCOUNTER — Ambulatory Visit (HOSPITAL_COMMUNITY)
Admission: RE | Admit: 2020-10-07 | Discharge: 2020-10-07 | Disposition: A | Discharge status: Home / Self Care | Payer: BC Managed Care – PPO | Source: Ambulatory Visit | Attending: Family | Admitting: Family

## 2020-10-07 DIAGNOSIS — Z1231 Encounter for screening mammogram for malignant neoplasm of breast: Secondary | ICD-10-CM | POA: Diagnosis not present

## 2020-10-14 ENCOUNTER — Encounter: Payer: Self-pay | Admitting: Family

## 2020-10-26 DIAGNOSIS — E890 Postprocedural hypothyroidism: Secondary | ICD-10-CM | POA: Diagnosis not present

## 2020-10-26 DIAGNOSIS — I10 Essential (primary) hypertension: Secondary | ICD-10-CM | POA: Diagnosis not present

## 2020-10-26 DIAGNOSIS — K219 Gastro-esophageal reflux disease without esophagitis: Secondary | ICD-10-CM | POA: Diagnosis not present

## 2020-11-04 DIAGNOSIS — Z0000 Encounter for general adult medical examination without abnormal findings: Secondary | ICD-10-CM | POA: Diagnosis not present

## 2020-11-08 ENCOUNTER — Telehealth: Payer: Self-pay

## 2020-11-08 DIAGNOSIS — I10 Essential (primary) hypertension: Secondary | ICD-10-CM

## 2020-11-08 DIAGNOSIS — E039 Hypothyroidism, unspecified: Secondary | ICD-10-CM

## 2020-11-08 NOTE — Telephone Encounter (Signed)
It looks like she had a visit for an annually physical 11/04/20. Did she have lab work then?

## 2020-11-09 NOTE — Addendum Note (Signed)
Addended by: Ladean Raya on: 11/09/2020 08:23 AM   Modules accepted: Orders

## 2020-11-09 NOTE — Telephone Encounter (Signed)
Patient stated that was just for work. She has not had labs done will put in order.

## 2020-11-11 ENCOUNTER — Ambulatory Visit (INDEPENDENT_AMBULATORY_CARE_PROVIDER_SITE_OTHER): Payer: BC Managed Care – PPO | Admitting: Family

## 2020-11-11 ENCOUNTER — Other Ambulatory Visit (HOSPITAL_COMMUNITY)
Admission: RE | Admit: 2020-11-11 | Discharge: 2020-11-11 | Disposition: A | Discharge status: Home / Self Care | Payer: BC Managed Care – PPO | Source: Ambulatory Visit | Attending: Family | Admitting: Family

## 2020-11-11 ENCOUNTER — Encounter: Payer: Self-pay | Admitting: Family

## 2020-11-11 ENCOUNTER — Other Ambulatory Visit: Payer: Self-pay

## 2020-11-11 VITALS — BP 133/74 | HR 77 | Temp 97.0°F | Ht 65.0 in | Wt 163.6 lb

## 2020-11-11 DIAGNOSIS — Z0001 Encounter for general adult medical examination with abnormal findings: Secondary | ICD-10-CM

## 2020-11-11 DIAGNOSIS — Z79899 Other long term (current) drug therapy: Secondary | ICD-10-CM | POA: Diagnosis not present

## 2020-11-11 DIAGNOSIS — I10 Essential (primary) hypertension: Secondary | ICD-10-CM

## 2020-11-11 DIAGNOSIS — Z0000 Encounter for general adult medical examination without abnormal findings: Secondary | ICD-10-CM

## 2020-11-11 DIAGNOSIS — F419 Anxiety disorder, unspecified: Secondary | ICD-10-CM

## 2020-11-11 DIAGNOSIS — Z1211 Encounter for screening for malignant neoplasm of colon: Secondary | ICD-10-CM

## 2020-11-11 DIAGNOSIS — R232 Flushing: Secondary | ICD-10-CM

## 2020-11-11 DIAGNOSIS — Z01411 Encounter for gynecological examination (general) (routine) with abnormal findings: Secondary | ICD-10-CM

## 2020-11-11 DIAGNOSIS — E039 Hypothyroidism, unspecified: Secondary | ICD-10-CM

## 2020-11-11 DIAGNOSIS — Z01419 Encounter for gynecological examination (general) (routine) without abnormal findings: Secondary | ICD-10-CM | POA: Insufficient documentation

## 2020-11-11 DIAGNOSIS — K219 Gastro-esophageal reflux disease without esophagitis: Secondary | ICD-10-CM

## 2020-11-11 MED ORDER — LEVOTHYROXINE SODIUM 50 MCG PO TABS: 50.0000 ug | ORAL_TABLET | Freq: Every day | ORAL | 0 refills | Status: DC

## 2020-11-11 MED ORDER — DIAZEPAM 5 MG PO TABS
5.0000 mg | ORAL_TABLET | Freq: Two times a day (BID) | ORAL | 3 refills | Status: DC | PRN
Start: 2020-11-11 — End: 2021-05-12

## 2020-11-11 MED ORDER — GABAPENTIN 600 MG PO TABS
600.0000 mg | ORAL_TABLET | Freq: Every day | ORAL | 4 refills | Status: DC
Start: 2020-11-11 — End: 2021-12-01

## 2020-11-11 MED ORDER — LISINOPRIL-HYDROCHLOROTHIAZIDE 20-12.5 MG PO TABS
1.0000 | ORAL_TABLET | Freq: Every day | ORAL | 2 refills | Status: DC
Start: 2020-11-11 — End: 2021-12-13

## 2020-11-11 NOTE — Patient Instructions (Signed)
Health Maintenance, Female Adopting a healthy lifestyle and getting preventive care are important in promoting health and wellness. Ask your health care provider about:  The right schedule for you to have regular tests and exams.  Things you can do on your own to prevent diseases and keep yourself healthy. What should I know about diet, weight, and exercise? Eat a healthy diet  Eat a diet that includes plenty of vegetables, fruits, low-fat dairy products, and lean protein.  Do not eat a lot of foods that are high in solid fats, added sugars, or sodium.   Maintain a healthy weight Body mass index (BMI) is used to identify weight problems. It estimates body fat based on height and weight. Your health care provider can help determine your BMI and help you achieve or maintain a healthy weight. Get regular exercise Get regular exercise. This is one of the most important things you can do for your health. Most adults should:  Exercise for at least 150 minutes each week. The exercise should increase your heart rate and make you sweat (moderate-intensity exercise).  Do strengthening exercises at least twice a week. This is in addition to the moderate-intensity exercise.  Spend less time sitting. Even light physical activity can be beneficial. Watch cholesterol and blood lipids Have your blood tested for lipids and cholesterol at 62 years of age, then have this test every 5 years. Have your cholesterol levels checked more often if:  Your lipid or cholesterol levels are high.  You are older than 62 years of age.  You are at high risk for heart disease. What should I know about cancer screening? Depending on your health history and family history, you may need to have cancer screening at various ages. This may include screening for:  Breast cancer.  Cervical cancer.  Colorectal cancer.  Skin cancer.  Lung cancer. What should I know about heart disease, diabetes, and high blood  pressure? Blood pressure and heart disease  High blood pressure causes heart disease and increases the risk of stroke. This is more likely to develop in people who have high blood pressure readings, are of African descent, or are overweight.  Have your blood pressure checked: ? Every 3-5 years if you are 18-39 years of age. ? Every year if you are 40 years old or older. Diabetes Have regular diabetes screenings. This checks your fasting blood sugar level. Have the screening done:  Once every three years after age 40 if you are at a normal weight and have a low risk for diabetes.  More often and at a younger age if you are overweight or have a high risk for diabetes. What should I know about preventing infection? Hepatitis B If you have a higher risk for hepatitis B, you should be screened for this virus. Talk with your health care provider to find out if you are at risk for hepatitis B infection. Hepatitis C Testing is recommended for:  Everyone born from 1945 through 1965.  Anyone with known risk factors for hepatitis C. Sexually transmitted infections (STIs)  Get screened for STIs, including gonorrhea and chlamydia, if: ? You are sexually active and are younger than 62 years of age. ? You are older than 62 years of age and your health care provider tells you that you are at risk for this type of infection. ? Your sexual activity has changed since you were last screened, and you are at increased risk for chlamydia or gonorrhea. Ask your health care provider   if you are at risk.  Ask your health care provider about whether you are at high risk for HIV. Your health care provider may recommend a prescription medicine to help prevent HIV infection. If you choose to take medicine to prevent HIV, you should first get tested for HIV. You should then be tested every 3 months for as long as you are taking the medicine. Pregnancy  If you are about to stop having your period (premenopausal) and  you may become pregnant, seek counseling before you get pregnant.  Take 400 to 800 micrograms (mcg) of folic acid every day if you become pregnant.  Ask for birth control (contraception) if you want to prevent pregnancy. Osteoporosis and menopause Osteoporosis is a disease in which the bones lose minerals and strength with aging. This can result in bone fractures. If you are 65 years old or older, or if you are at risk for osteoporosis and fractures, ask your health care provider if you should:  Be screened for bone loss.  Take a calcium or vitamin D supplement to lower your risk of fractures.  Be given hormone replacement therapy (HRT) to treat symptoms of menopause. Follow these instructions at home: Lifestyle  Do not use any products that contain nicotine or tobacco, such as cigarettes, e-cigarettes, and chewing tobacco. If you need help quitting, ask your health care provider.  Do not use street drugs.  Do not share needles.  Ask your health care provider for help if you need support or information about quitting drugs. Alcohol use  Do not drink alcohol if: ? Your health care provider tells you not to drink. ? You are pregnant, may be pregnant, or are planning to become pregnant.  If you drink alcohol: ? Limit how much you use to 0-1 drink a day. ? Limit intake if you are breastfeeding.  Be aware of how much alcohol is in your drink. In the [place] one drink equals one 12 oz bottle of beer (355 mL), one 5 oz glass of wine (148 mL), or one 1 oz glass of hard liquor (44 mL). General instructions  Schedule regular health, dental, and eye exams.  Stay current with your vaccines.  Tell your health care provider if: ? You often feel depressed. ? You have ever been abused or do not feel safe at home. Summary  Adopting a healthy lifestyle and getting preventive care are important in promoting health and wellness.  Follow your health care provider's instructions about healthy  diet, exercising, and getting tested or screened for diseases.  Follow your health care provider's instructions on monitoring your cholesterol and blood pressure. This information is not intended to replace advice given to you by your health care provider. Make sure you discuss any questions you have with your health care provider. Document Revised: 09/03/2018 Document Reviewed: 09/03/2018 Elsevier Patient Education  2021 Elsevier Inc.  

## 2020-11-11 NOTE — Progress Notes (Signed)
Subjective:    Patient ID: Pamela Santiago, female    DOB: 1961/07/13, 60 y.o.   MRN: [id number]  Chief Complaint  Patient presents with  . Annual Exam    With pap  . Hypertension    Has not taken BP med yet    Pt presents to the office today for CPE with pap. She reports she has hot flashes and has been taking gabapentin 600 mg at bedtime that is helping.  Hypertension This is a chronic problem. The current episode started more than 1 year ago. The problem has been resolved since onset. The problem is controlled. Associated symptoms include anxiety. Pertinent negatives include no malaise/fatigue, peripheral edema or shortness of breath. Risk factors for coronary artery disease include dyslipidemia, obesity and sedentary lifestyle. The current treatment provides moderate improvement. Identifiable causes of hypertension include a thyroid problem.  Gastroesophageal Reflux She complains of belching and heartburn. This is a chronic problem. The current episode started more than 1 year ago. The problem occurs occasionally. The problem has been waxing and waning. Pertinent negatives include no fatigue. She has tried a PPI for the symptoms. The treatment provided moderate relief.  Thyroid Problem Presents for follow-up visit. Symptoms include anxiety and dry skin. Patient reports no depressed mood or fatigue. The symptoms have been stable.  Anxiety Presents for follow-up visit. Symptoms include excessive worry, irritability and nervous/anxious behavior. Patient reports no depressed mood or shortness of breath. Symptoms occur most days. The severity of symptoms is moderate.    Gynecologic Exam The patient's pertinent negatives include no genital lesions, vaginal bleeding or vaginal discharge. The treatment provided no relief.      Review of Systems  Constitutional: Positive for irritability. Negative for fatigue and malaise/fatigue.  Respiratory: Negative for shortness of breath.    Gastrointestinal: Positive for heartburn.  Genitourinary: Negative for vaginal discharge.  Psychiatric/Behavioral: The patient is nervous/anxious.   All other systems reviewed and are negative.  Family History  Problem Relation Age of Onset  . COPD Mother   . Stroke Father   . Seizures Sister   . Hypertension Sister    Social History   Socioeconomic History  . Marital status: Divorced    Spouse name: Not on file  . Number of children: Not on file  . Years of education: Not on file  . Highest education level: Not on file  Occupational History  . Not on file  Tobacco Use  . Smoking status: Former Smoker    Quit date: 02/05/2002    Years since quitting: 18.7  . Smokeless tobacco: Never Used  Vaping Use  . Vaping Use: Never used  Substance and Sexual Activity  . Alcohol use: Yes    Comment: occasional  . Drug use: No  . Sexual activity: Not on file  Other Topics Concern  . Not on file  Social History Narrative  . Not on file   Social Determinants of Health   Financial Resource Strain: Not on file  Food Insecurity: Not on file  Transportation Needs: Not on file  Physical Activity: Not on file  Stress: Not on file  Social Connections: Not on file       Objective:   Physical Exam Vitals reviewed.  Constitutional:      General: She is not in acute distress.    Appearance: She is well-developed and well-nourished.  HENT:     Head: Normocephalic and atraumatic.     Right Ear: Tympanic membrane normal.  Left Ear: Tympanic membrane normal.     Mouth/Throat:     Mouth: Oropharynx is clear and moist.  Eyes:     Pupils: Pupils are equal, round, and reactive to light.  Neck:     Thyroid: No thyromegaly.  Cardiovascular:     Rate and Rhythm: Normal rate and regular rhythm.     Pulses: Intact distal pulses.     Heart sounds: Normal heart sounds. No murmur heard.   Pulmonary:     Effort: Pulmonary effort is normal. No respiratory distress.     Breath sounds:  Normal breath sounds. No wheezing.  Chest:  Breasts:     Right: No swelling, bleeding, inverted nipple, mass, nipple discharge, skin change or tenderness.     Left: No swelling, bleeding, inverted nipple, mass, nipple discharge, skin change or tenderness.    Abdominal:     General: Bowel sounds are normal. There is no distension.     Palpations: Abdomen is soft.     Tenderness: There is no abdominal tenderness.  Genitourinary:    Comments: Bimanual exam- no adnexal masses or tenderness, ovaries nonpalpable   Cervix parous and pink- No discharge  Musculoskeletal:        General: No tenderness or edema. Normal range of motion.     Cervical back: Normal range of motion and neck supple.  Skin:    General: Skin is warm and dry.  Neurological:     Mental Status: She is alert and oriented to person, place, and time.     Cranial Nerves: No cranial nerve deficit.     Deep Tendon Reflexes: Reflexes are normal and symmetric.  Psychiatric:        Mood and Affect: Mood and affect normal.        Behavior: Behavior normal.        Thought Content: Thought content normal.        Judgment: Judgment normal.        BP 133/74   Pulse 77   Temp (!) 97 F (36.1 C) (Temporal)   Ht 5\' 5"  (1.651 m)   Wt 163 lb 9.6 oz (74.2 kg)   BMI 27.22 kg/m   Assessment & Plan:  Pamela Santiago comes in today with chief complaint of Annual Exam (With pap) and Hypertension (Has not taken BP med yet )   Diagnosis and orders addressed:  1. Anxiety - diazepam (VALIUM) 5 MG tablet; Take 1 tablet (5 mg total) by mouth every 12 (twelve) hours as needed for anxiety.  Dispense: 20 tablet; Refill: 3  2. Controlled substance agreement signed - diazepam (VALIUM) 5 MG tablet; Take 1 tablet (5 mg total) by mouth every 12 (twelve) hours as needed for anxiety.  Dispense: 20 tablet; Refill: 3  3. Hypothyroidism, unspecified type - levothyroxine (SYNTHROID) 50 MCG tablet; Take 1 tablet (50 mcg total) by mouth daily.   Dispense: 90 tablet; Refill: 0  4. Essential hypertension - lisinopril-hydrochlorothiazide (ZESTORETIC) 20-12.5 MG tablet; Take 1 tablet by mouth daily.  Dispense: 90 tablet; Refill: 2  5. Well adult exam - gabapentin (NEURONTIN) 600 MG tablet; Take 1 tablet (600 mg total) by mouth at bedtime.  Dispense: 90 tablet; Refill: 4  6. Annual physical exam - Cytology - PAP([facility])  7. Hot flash not due to menopause  8. Gastroesophageal reflux disease, unspecified whether esophagitis present  9. Gynecologic exam normal - Cytology - PAP([place])  10. Colon cancer screening - Ambulatory referral to Gastroenterology   Labs  pending Patient reviewed in [place] controlled database, no flags noted. Contract and drug screen are up to date.  Health Maintenance reviewed Diet and exercise encouraged  Follow up plan: 6 months    Evelina Dun, FNP

## 2020-11-12 LAB — THYROID PANEL WITH TSH
Free Thyroxine Index: 3 (ref 1.2–4.9)
T3 Uptake Ratio: 35 % (ref 24–39)
T4, Total: 8.6 ug/dL (ref 4.5–12.0)
TSH: 0.888 u[IU]/mL (ref 0.450–4.500)

## 2020-11-12 LAB — CMP14+EGFR
ALT: 31 IU/L (ref 0–32)
AST: 30 IU/L (ref 0–40)
Albumin/Globulin Ratio: 1.9 (ref 1.2–2.2)
Albumin: 4.7 g/dL (ref 3.8–4.8)
Alkaline Phosphatase: 76 IU/L (ref 44–121)
BUN/Creatinine Ratio: 34 — ABNORMAL HIGH (ref 12–28)
BUN: 21 mg/dL (ref 8–27)
Bilirubin Total: 0.4 mg/dL (ref 0.0–1.2)
CO2: 23 mmol/L (ref 20–29)
Calcium: 9.8 mg/dL (ref 8.7–10.3)
Chloride: 95 mmol/L — ABNORMAL LOW (ref 96–106)
Creatinine, Ser: 0.62 mg/dL (ref 0.57–1.00)
GFR calc Af Amer: 113 mL/min/{1.73_m2} (ref >59)
GFR calc non Af Amer: 98 mL/min/{1.73_m2} (ref >59)
Globulin, Total: 2.5 g/dL (ref 1.5–4.5)
Glucose: 88 mg/dL (ref 65–99)
Potassium: 4.6 mmol/L (ref 3.5–5.2)
Sodium: 137 mmol/L (ref 134–144)
Total Protein: 7.2 g/dL (ref 6.0–8.5)

## 2020-11-12 LAB — CBC WITH DIFFERENTIAL/PLATELET
Basophils Absolute: 0 10*3/uL (ref 0.0–0.2)
Basos: 0 %
EOS (ABSOLUTE): 0.1 10*3/uL (ref 0.0–0.4)
Eos: 1 %
Hematocrit: 41.1 % (ref 34.0–46.6)
Hemoglobin: 13.9 g/dL (ref 11.1–15.9)
Immature Grans (Abs): 0 10*3/uL (ref 0.0–0.1)
Immature Granulocytes: 0 %
Lymphocytes Absolute: 2.9 10*3/uL (ref 0.7–3.1)
Lymphs: 42 %
MCH: 32.9 pg (ref 26.6–33.0)
MCHC: 33.8 g/dL (ref 31.5–35.7)
MCV: 97 fL (ref 79–97)
Monocytes Absolute: 0.5 10*3/uL (ref 0.1–0.9)
Monocytes: 7 %
Neutrophils Absolute: 3.4 10*3/uL (ref 1.4–7.0)
Neutrophils: 50 %
Platelets: 243 10*3/uL (ref 150–450)
RBC: 4.22 x10E6/uL (ref 3.77–5.28)
RDW: 12.6 % (ref 11.7–15.4)
WBC: 6.8 10*3/uL (ref 3.4–10.8)

## 2020-11-12 LAB — LIPID PANEL
Chol/HDL Ratio: 3.3 ratio (ref 0.0–4.4)
Cholesterol, Total: 258 mg/dL — ABNORMAL HIGH (ref 100–199)
HDL: 79 mg/dL (ref >39)
LDL Chol Calc (NIH): 169 mg/dL — ABNORMAL HIGH (ref 0–99)
Triglycerides: 61 mg/dL (ref 0–149)
VLDL Cholesterol Cal: 10 mg/dL (ref 5–40)

## 2020-11-15 LAB — CYTOLOGY - PAP: Diagnosis: NEGATIVE

## 2020-11-18 ENCOUNTER — Other Ambulatory Visit: Payer: Self-pay | Admitting: Family

## 2020-12-24 DIAGNOSIS — Z1211 Encounter for screening for malignant neoplasm of colon: Secondary | ICD-10-CM | POA: Diagnosis not present

## 2021-01-09 DIAGNOSIS — Z1211 Encounter for screening for malignant neoplasm of colon: Secondary | ICD-10-CM | POA: Diagnosis not present

## 2021-01-16 DIAGNOSIS — K635 Polyp of colon: Secondary | ICD-10-CM | POA: Diagnosis not present

## 2021-01-25 ENCOUNTER — Encounter: Payer: Self-pay | Admitting: Family Medicine

## 2021-02-21 DIAGNOSIS — D369 Benign neoplasm, unspecified site: Secondary | ICD-10-CM | POA: Diagnosis not present

## 2021-04-21 DIAGNOSIS — Z713 Dietary counseling and surveillance: Secondary | ICD-10-CM | POA: Diagnosis not present

## 2021-05-04 ENCOUNTER — Other Ambulatory Visit: Payer: Self-pay | Admitting: Family Medicine

## 2021-05-04 ENCOUNTER — Telehealth: Payer: Self-pay | Admitting: Family

## 2021-05-04 DIAGNOSIS — I10 Essential (primary) hypertension: Secondary | ICD-10-CM

## 2021-05-04 NOTE — Telephone Encounter (Signed)
Orders placed patient aware

## 2021-05-10 ENCOUNTER — Other Ambulatory Visit: Payer: Self-pay

## 2021-05-10 ENCOUNTER — Other Ambulatory Visit: Payer: BC Managed Care – PPO

## 2021-05-10 DIAGNOSIS — I10 Essential (primary) hypertension: Secondary | ICD-10-CM | POA: Diagnosis not present

## 2021-05-10 LAB — CMP14+EGFR
ALT: 17 IU/L (ref 0–32)
AST: 23 IU/L (ref 0–40)
Albumin/Globulin Ratio: 1.7 (ref 1.2–2.2)
Albumin: 4.7 g/dL (ref 3.8–4.8)
Alkaline Phosphatase: 74 IU/L (ref 44–121)
BUN/Creatinine Ratio: 48 — ABNORMAL HIGH (ref 12–28)
BUN: 28 mg/dL — ABNORMAL HIGH (ref 8–27)
Bilirubin Total: 0.2 mg/dL (ref 0.0–1.2)
CO2: 23 mmol/L (ref 20–29)
Calcium: 10.1 mg/dL (ref 8.7–10.3)
Chloride: 97 mmol/L (ref 96–106)
Creatinine, Ser: 0.58 mg/dL (ref 0.57–1.00)
Globulin, Total: 2.7 g/dL (ref 1.5–4.5)
Glucose: 98 mg/dL (ref 65–99)
Potassium: 4.8 mmol/L (ref 3.5–5.2)
Sodium: 136 mmol/L (ref 134–144)
Total Protein: 7.4 g/dL (ref 6.0–8.5)
eGFR: 102 mL/min/{1.73_m2} (ref >59)

## 2021-05-10 LAB — LIPID PANEL
Chol/HDL Ratio: 3.9 ratio (ref 0.0–4.4)
Cholesterol, Total: 276 mg/dL — ABNORMAL HIGH (ref 100–199)
HDL: 70 mg/dL (ref >39)
LDL Chol Calc (NIH): 184 mg/dL — ABNORMAL HIGH (ref 0–99)
Triglycerides: 125 mg/dL (ref 0–149)
VLDL Cholesterol Cal: 22 mg/dL (ref 5–40)

## 2021-05-10 LAB — CBC WITH DIFFERENTIAL/PLATELET
Basophils Absolute: 0 10*3/uL (ref 0.0–0.2)
Basos: 1 %
EOS (ABSOLUTE): 0.1 10*3/uL (ref 0.0–0.4)
Eos: 1 %
Hematocrit: 40.9 % (ref 34.0–46.6)
Hemoglobin: 13.8 g/dL (ref 11.1–15.9)
Immature Grans (Abs): 0 10*3/uL (ref 0.0–0.1)
Immature Granulocytes: 0 %
Lymphocytes Absolute: 2.5 10*3/uL (ref 0.7–3.1)
Lymphs: 41 %
MCH: 32.8 pg (ref 26.6–33.0)
MCHC: 33.7 g/dL (ref 31.5–35.7)
MCV: 97 fL (ref 79–97)
Monocytes Absolute: 0.4 10*3/uL (ref 0.1–0.9)
Monocytes: 7 %
Neutrophils Absolute: 3.1 10*3/uL (ref 1.4–7.0)
Neutrophils: 50 %
Platelets: 245 10*3/uL (ref 150–450)
RBC: 4.21 x10E6/uL (ref 3.77–5.28)
RDW: 12.8 % (ref 11.7–15.4)
WBC: 6.2 10*3/uL (ref 3.4–10.8)

## 2021-05-12 ENCOUNTER — Encounter: Payer: Self-pay | Admitting: Family

## 2021-05-12 ENCOUNTER — Ambulatory Visit: Payer: BC Managed Care – PPO | Admitting: Family

## 2021-05-12 ENCOUNTER — Other Ambulatory Visit: Payer: Self-pay

## 2021-05-12 VITALS — BP 138/74 | HR 68 | Temp 98.0°F | Ht 65.0 in | Wt 160.4 lb

## 2021-05-12 DIAGNOSIS — R232 Flushing: Secondary | ICD-10-CM | POA: Diagnosis not present

## 2021-05-12 DIAGNOSIS — Z23 Encounter for immunization: Secondary | ICD-10-CM | POA: Diagnosis not present

## 2021-05-12 DIAGNOSIS — H5213 Myopia, bilateral: Secondary | ICD-10-CM | POA: Diagnosis not present

## 2021-05-12 DIAGNOSIS — M542 Cervicalgia: Secondary | ICD-10-CM | POA: Insufficient documentation

## 2021-05-12 DIAGNOSIS — E039 Hypothyroidism, unspecified: Secondary | ICD-10-CM

## 2021-05-12 DIAGNOSIS — I10 Essential (primary) hypertension: Secondary | ICD-10-CM | POA: Diagnosis not present

## 2021-05-12 DIAGNOSIS — Z79899 Other long term (current) drug therapy: Secondary | ICD-10-CM | POA: Diagnosis not present

## 2021-05-12 DIAGNOSIS — F419 Anxiety disorder, unspecified: Secondary | ICD-10-CM | POA: Diagnosis not present

## 2021-05-12 DIAGNOSIS — K219 Gastro-esophageal reflux disease without esophagitis: Secondary | ICD-10-CM

## 2021-05-12 MED ORDER — DIAZEPAM 5 MG PO TABS: 5.0000 mg | ORAL_TABLET | Freq: Two times a day (BID) | ORAL | 3 refills | Status: DC | PRN

## 2021-05-12 NOTE — Patient Instructions (Signed)
Health Maintenance, Female Adopting a healthy lifestyle and getting preventive care are important in promoting health and wellness. Ask your health care provider about: The right schedule for you to have regular tests and exams. Things you can do on your own to prevent diseases and keep yourself healthy. What should I know about diet, weight, and exercise? Eat a healthy diet  Eat a diet that includes plenty of vegetables, fruits, low-fat dairy products, and lean protein. Do not eat a lot of foods that are high in solid fats, added sugars, or sodium.  Maintain a healthy weight Body mass index (BMI) is used to identify weight problems. It estimates body fat based on height and weight. Your health care provider can help determineyour BMI and help you achieve or maintain a healthy weight. Get regular exercise Get regular exercise. This is one of the most important things you can do for your health. Most adults should: Exercise for at least 150 minutes each week. The exercise should increase your heart rate and make you sweat (moderate-intensity exercise). Do strengthening exercises at least twice a week. This is in addition to the moderate-intensity exercise. Spend less time sitting. Even light physical activity can be beneficial. Watch cholesterol and blood lipids Have your blood tested for lipids and cholesterol at 62 years of age, then havethis test every 5 years. Have your cholesterol levels checked more often if: Your lipid or cholesterol levels are high. You are older than 62 years of age. You are at high risk for heart disease. What should I know about cancer screening? Depending on your health history and family history, you may need to have cancer screening at various ages. This may include screening for: Breast cancer. Cervical cancer. Colorectal cancer. Skin cancer. Lung cancer. What should I know about heart disease, diabetes, and high blood pressure? Blood pressure and heart  disease High blood pressure causes heart disease and increases the risk of stroke. This is more likely to develop in people who have high blood pressure readings, are of African descent, or are overweight. Have your blood pressure checked: Every 3-5 years if you are 18-39 years of age. Every year if you are 40 years old or older. Diabetes Have regular diabetes screenings. This checks your fasting blood sugar level. Have the screening done: Once every three years after age 40 if you are at a normal weight and have a low risk for diabetes. More often and at a younger age if you are overweight or have a high risk for diabetes. What should I know about preventing infection? Hepatitis B If you have a higher risk for hepatitis B, you should be screened for this virus. Talk with your health care provider to find out if you are at risk forhepatitis B infection. Hepatitis C Testing is recommended for: Everyone born from 1945 through 1965. Anyone with known risk factors for hepatitis C. Sexually transmitted infections (STIs) Get screened for STIs, including gonorrhea and chlamydia, if: You are sexually active and are younger than 62 years of age. You are older than 62 years of age and your health care provider tells you that you are at risk for this type of infection. Your sexual activity has changed since you were last screened, and you are at increased risk for chlamydia or gonorrhea. Ask your health care provider if you are at risk. Ask your health care provider about whether you are at high risk for HIV. Your health care provider may recommend a prescription medicine to help   prevent HIV infection. If you choose to take medicine to prevent HIV, you should first get tested for HIV. You should then be tested every 3 months for as long as you are taking the medicine. Pregnancy If you are about to stop having your period (premenopausal) and you may become pregnant, seek counseling before you get  pregnant. Take 400 to 800 micrograms (mcg) of folic acid every day if you become pregnant. Ask for birth control (contraception) if you want to prevent pregnancy. Osteoporosis and menopause Osteoporosis is a disease in which the bones lose minerals and strength with aging. This can result in bone fractures. If you are 65 years old or older, or if you are at risk for osteoporosis and fractures, ask your health care provider if you should: Be screened for bone loss. Take a calcium or vitamin D supplement to lower your risk of fractures. Be given hormone replacement therapy (HRT) to treat symptoms of menopause. Follow these instructions at home: Lifestyle Do not use any products that contain nicotine or tobacco, such as cigarettes, e-cigarettes, and chewing tobacco. If you need help quitting, ask your health care provider. Do not use street drugs. Do not share needles. Ask your health care provider for help if you need support or information about quitting drugs. Alcohol use Do not drink alcohol if: Your health care provider tells you not to drink. You are pregnant, may be pregnant, or are planning to become pregnant. If you drink alcohol: Limit how much you use to 0-1 drink a day. Limit intake if you are breastfeeding. Be aware of how much alcohol is in your drink. In the [place] one drink equals one 12 oz bottle of beer (355 mL), one 5 oz glass of wine (148 mL), or one 1 oz glass of hard liquor (44 mL). General instructions Schedule regular health, dental, and eye exams. Stay current with your vaccines. Tell your health care provider if: You often feel depressed. You have ever been abused or do not feel safe at home. Summary Adopting a healthy lifestyle and getting preventive care are important in promoting health and wellness. Follow your health care provider's instructions about healthy diet, exercising, and getting tested or screened for diseases. Follow your health care provider's  instructions on monitoring your cholesterol and blood pressure. This information is not intended to replace advice given to you by your health care provider. Make sure you discuss any questions you have with your healthcare provider. Document Revised: 09/03/2018 Document Reviewed: 09/03/2018 Elsevier Patient Education  2022 Elsevier Inc.  

## 2021-05-12 NOTE — Progress Notes (Signed)
Subjective:    Patient ID: Pamela Santiago, female    DOB: 1961/07/16, 60 y.o.   MRN: [id number]  Chief Complaint  Patient presents with   Medical Management of Chronic Issues   Pt presents to the office today for chronic follow up. She reports she has hot flashes and has been taking gabapentin 600 mg at bedtime that is helping.   She reports the last time she was at the dentist they noticed a black dot. It has been unchanged for the last 8 months.  Hypertension This is a chronic problem. The problem has been resolved since onset. The problem is controlled. Associated symptoms include neck pain. Pertinent negatives include no malaise/fatigue or peripheral edema. Risk factors for coronary artery disease include dyslipidemia and sedentary lifestyle. The current treatment provides moderate improvement. Identifiable causes of hypertension include a thyroid problem.  Gastroesophageal Reflux She complains of belching and heartburn. This is a chronic problem. The current episode started more than 1 year ago. The problem occurs occasionally. The symptoms are aggravated by certain foods. Associated symptoms include fatigue. She has tried a PPI for the symptoms. The treatment provided moderate relief.  Thyroid Problem Presents for follow-up visit. Symptoms include fatigue. The symptoms have been stable.  Neck Pain  This is a chronic problem. The current episode started more than 1 year ago. The problem occurs intermittently. The pain is present in the anterior neck. The pain is mild.     Review of Systems  Constitutional:  Positive for fatigue. Negative for malaise/fatigue.  Gastrointestinal:  Positive for heartburn.  Musculoskeletal:  Positive for neck pain.  All other systems reviewed and are negative.     Objective:   Physical Exam Vitals reviewed.  Constitutional:      General: She is not in acute distress.    Appearance: She is well-developed.  HENT:     Head: Normocephalic and  atraumatic.     Right Ear: Tympanic membrane normal.     Left Ear: Tympanic membrane normal.  Eyes:     Pupils: Pupils are equal, round, and reactive to light.  Neck:     Thyroid: No thyromegaly.  Cardiovascular:     Rate and Rhythm: Normal rate and regular rhythm.     Heart sounds: Normal heart sounds. No murmur heard. Pulmonary:     Effort: Pulmonary effort is normal. No respiratory distress.     Breath sounds: Normal breath sounds. No wheezing.  Abdominal:     General: Bowel sounds are normal. There is no distension.     Palpations: Abdomen is soft.     Tenderness: There is no abdominal tenderness.  Musculoskeletal:        General: No tenderness. Normal range of motion.     Cervical back: Normal range of motion and neck supple.  Skin:    General: Skin is warm and dry.  Neurological:     Mental Status: She is alert and oriented to person, place, and time.     Cranial Nerves: No cranial nerve deficit.     Deep Tendon Reflexes: Reflexes are normal and symmetric.  Psychiatric:        Behavior: Behavior normal.        Thought Content: Thought content normal.        Judgment: Judgment normal.      BP 138/74   Pulse 68   Temp 98 F (36.7 C) (Temporal)   Ht '5\' 5"'$  (1.651 m)   Wt 160 lb 6.4 oz (  72.8 kg)   BMI 26.69 kg/m      Assessment & Plan:  Pamela Santiago comes in today with chief complaint of Medical Management of Chronic Issues   Diagnosis and orders addressed:  1. Anxiety - diazepam (VALIUM) 5 MG tablet; Take 1 tablet (5 mg total) by mouth every 12 (twelve) hours as needed for anxiety.  Dispense: 20 tablet; Refill: 3 - ToxAS[employer] Select 13 (MW), Urine  2. Controlled substance agreement signed - diazepam (VALIUM) 5 MG tablet; Take 1 tablet (5 mg total) by mouth every 12 (twelve) hours as needed for anxiety.  Dispense: 20 tablet; Refill: 3 - ToxASSURE Select 13 (MW), Urine  3. Essential hypertension  4. Hot flash not due to menopause  5. Gastroesophageal  reflux disease, unspecified whether esophagitis present  6. Hypothyroidism, unspecified type  7. Neck pain   Labs pending Patient reviewed in [place] controlled database, no flags noted. Contract and drug screen are up to date.  Health Maintenance reviewed Diet and exercise encouraged  Follow up plan: 6 months    Evelina Dun, FNP

## 2021-05-17 LAB — TOXASSURE SELECT 13 (MW), URINE

## 2021-06-21 ENCOUNTER — Other Ambulatory Visit: Payer: Self-pay | Admitting: Family

## 2021-06-21 DIAGNOSIS — E039 Hypothyroidism, unspecified: Secondary | ICD-10-CM

## 2021-07-07 DIAGNOSIS — Z713 Dietary counseling and surveillance: Secondary | ICD-10-CM | POA: Diagnosis not present

## 2021-07-19 DIAGNOSIS — Z23 Encounter for immunization: Secondary | ICD-10-CM | POA: Diagnosis not present

## 2021-08-04 ENCOUNTER — Encounter: Payer: Self-pay | Admitting: *Deleted

## 2021-10-13 DIAGNOSIS — Z713 Dietary counseling and surveillance: Secondary | ICD-10-CM | POA: Diagnosis not present

## 2021-10-23 ENCOUNTER — Telehealth: Payer: Self-pay | Admitting: Family

## 2021-10-23 ENCOUNTER — Other Ambulatory Visit (HOSPITAL_COMMUNITY): Payer: Self-pay | Admitting: Family

## 2021-10-23 DIAGNOSIS — Z1231 Encounter for screening mammogram for malignant neoplasm of breast: Secondary | ICD-10-CM

## 2021-10-23 MED ORDER — PANTOPRAZOLE SODIUM 20 MG PO TBEC: 20.0000 mg | DELAYED_RELEASE_TABLET | Freq: Every day | ORAL | 1 refills | Status: DC

## 2021-10-23 NOTE — Telephone Encounter (Signed)
Prescription sent to pharmacy.

## 2021-10-23 NOTE — Telephone Encounter (Signed)
Patient aware and verbalized understanding. °

## 2021-11-03 ENCOUNTER — Encounter (HOSPITAL_COMMUNITY): Payer: Self-pay

## 2021-11-03 ENCOUNTER — Ambulatory Visit (HOSPITAL_COMMUNITY)
Admission: RE | Admit: 2021-11-03 | Discharge: 2021-11-03 | Disposition: A | Discharge status: Home / Self Care | Payer: BC Managed Care – PPO | Source: Ambulatory Visit | Attending: Family | Admitting: Family

## 2021-11-03 ENCOUNTER — Other Ambulatory Visit: Payer: Self-pay

## 2021-11-03 DIAGNOSIS — Z1231 Encounter for screening mammogram for malignant neoplasm of breast: Secondary | ICD-10-CM | POA: Insufficient documentation

## 2021-11-13 ENCOUNTER — Ambulatory Visit: Payer: BC Managed Care – PPO | Admitting: Family

## 2021-11-14 ENCOUNTER — Ambulatory Visit: Payer: BC Managed Care – PPO | Admitting: Family

## 2021-11-14 ENCOUNTER — Encounter: Payer: Self-pay | Admitting: Family

## 2021-11-14 VITALS — BP 132/67 | HR 71 | Temp 98.3°F | Ht 65.0 in | Wt 163.8 lb

## 2021-11-14 DIAGNOSIS — R232 Flushing: Secondary | ICD-10-CM | POA: Diagnosis not present

## 2021-11-14 DIAGNOSIS — K219 Gastro-esophageal reflux disease without esophagitis: Secondary | ICD-10-CM

## 2021-11-14 DIAGNOSIS — M542 Cervicalgia: Secondary | ICD-10-CM

## 2021-11-14 DIAGNOSIS — Z23 Encounter for immunization: Secondary | ICD-10-CM | POA: Diagnosis not present

## 2021-11-14 DIAGNOSIS — I10 Essential (primary) hypertension: Secondary | ICD-10-CM

## 2021-11-14 DIAGNOSIS — F419 Anxiety disorder, unspecified: Secondary | ICD-10-CM | POA: Diagnosis not present

## 2021-11-14 DIAGNOSIS — Z79899 Other long term (current) drug therapy: Secondary | ICD-10-CM | POA: Diagnosis not present

## 2021-11-14 DIAGNOSIS — E039 Hypothyroidism, unspecified: Secondary | ICD-10-CM

## 2021-11-14 MED ORDER — METHOCARBAMOL 500 MG PO TABS
500.0000 mg | ORAL_TABLET | Freq: Three times a day (TID) | ORAL | 3 refills | Status: DC | PRN
Start: 2021-11-14 — End: 2022-05-17

## 2021-11-14 MED ORDER — DIAZEPAM 5 MG PO TABS: 5.0000 mg | ORAL_TABLET | Freq: Two times a day (BID) | ORAL | 3 refills | Status: DC | PRN

## 2021-11-14 NOTE — Progress Notes (Signed)
Subjective:    Patient ID: Pamela Santiago, female    DOB: 1961-05-04, 61 y.o.   MRN: [id number]  Chief Complaint  Patient presents with   Medical Management of Chronic Issues    No concerns today    Pt presents to the office today for chronic follow up. She reports she has hot flashes and has been taking gabapentin 600 mg at bedtime that is helping.   She has chronic neck pain that is constant. She has tried other muscle relaxer without success. She takes valium as needed. She only uses Valium 2 mg #20 for 6 months.  Hypertension This is a chronic problem. The current episode started more than 1 year ago. The problem has been resolved since onset. The problem is controlled. Associated symptoms include neck pain. Pertinent negatives include no malaise/fatigue, peripheral edema or shortness of breath. Risk factors for coronary artery disease include dyslipidemia. The current treatment provides moderate improvement. Identifiable causes of hypertension include a thyroid problem.  Gastroesophageal Reflux She complains of belching and heartburn. She reports no hoarse voice. This is a chronic problem. The current episode started more than 1 year ago. The problem occurs rarely. The problem has been waxing and waning. She has tried a PPI for the symptoms. The treatment provided moderate relief.  Thyroid Problem Presents for follow-up visit. Patient reports no constipation, diaphoresis, hair loss or hoarse voice. The symptoms have been stable.  Neck Pain  This is a chronic problem. The current episode started more than 1 year ago. The problem has been waxing and waning. The quality of the pain is described as aching. The pain is at a severity of 10/10. The pain is mild.     Review of Systems  Constitutional:  Negative for diaphoresis and malaise/fatigue.  HENT:  Negative for hoarse voice.   Respiratory:  Negative for shortness of breath.   Gastrointestinal:  Positive for heartburn. Negative for  constipation.  Musculoskeletal:  Positive for neck pain.  All other systems reviewed and are negative.     Objective:   Physical Exam Vitals reviewed.  Constitutional:      General: She is not in acute distress.    Appearance: She is well-developed.  HENT:     Head: Normocephalic and atraumatic.     Right Ear: Tympanic membrane normal.     Left Ear: Tympanic membrane normal.  Eyes:     Pupils: Pupils are equal, round, and reactive to light.  Neck:     Thyroid: No thyromegaly.  Cardiovascular:     Rate and Rhythm: Normal rate and regular rhythm.     Heart sounds: Normal heart sounds. No murmur heard. Pulmonary:     Effort: Pulmonary effort is normal. No respiratory distress.     Breath sounds: Normal breath sounds. No wheezing.  Abdominal:     General: Bowel sounds are normal. There is no distension.     Palpations: Abdomen is soft.     Tenderness: There is no abdominal tenderness.  Musculoskeletal:        General: No tenderness. Normal range of motion.     Cervical back: Normal range of motion and neck supple.  Skin:    General: Skin is warm and dry.  Neurological:     Mental Status: She is alert and oriented to person, place, and time.     Cranial Nerves: No cranial nerve deficit.     Deep Tendon Reflexes: Reflexes are normal and symmetric.  Psychiatric:  Behavior: Behavior normal.        Thought Content: Thought content normal.        Judgment: Judgment normal.     BP 132/67    Pulse 71    Temp 98.3 F (36.8 C) (Temporal)    Ht 5\' 5"  (1.651 m)    Wt 163 lb 12.8 oz (74.3 kg)    BMI 27.26 kg/m      Assessment & Plan:  Pamela Santiago comes in today with chief complaint of Medical Management of Chronic Issues (No concerns today )   Diagnosis and orders addressed:  1. Anxiety - diazepam (VALIUM) 5 MG tablet; Take 1 tablet (5 mg total) by mouth every 12 (twelve) hours as needed for anxiety.  Dispense: 20 tablet; Refill: 3 - methocarbamol (ROBAXIN) 500 MG  tablet; Take 1 tablet (500 mg total) by mouth every 8 (eight) hours as needed for muscle spasms.  Dispense: 90 tablet; Refill: 3  2. Controlled substance agreement signed - diazepam (VALIUM) 5 MG tablet; Take 1 tablet (5 mg total) by mouth every 12 (twelve) hours as needed for anxiety.  Dispense: 20 tablet; Refill: 3  3. Essential hypertension  4. Hot flash not due to menopause  5. Gastroesophageal reflux disease, unspecified whether esophagitis present  6. Hypothyroidism, unspecified type  7. Neck pain   Labs pending Patient reviewed in [place] controlled database, no flags noted. Contract and drug screen are up to date.  Health Maintenance reviewed Diet and exercise encouraged  Follow up plan: 6 months   Evelina Dun, FNP

## 2021-11-14 NOTE — Patient Instructions (Signed)

## 2021-12-01 ENCOUNTER — Other Ambulatory Visit: Payer: Self-pay | Admitting: Family

## 2021-12-01 DIAGNOSIS — Z0000 Encounter for general adult medical examination without abnormal findings: Secondary | ICD-10-CM

## 2021-12-13 ENCOUNTER — Other Ambulatory Visit: Payer: Self-pay | Admitting: Family

## 2021-12-13 DIAGNOSIS — I10 Essential (primary) hypertension: Secondary | ICD-10-CM

## 2021-12-16 ENCOUNTER — Other Ambulatory Visit: Payer: Self-pay | Admitting: Family

## 2021-12-19 ENCOUNTER — Other Ambulatory Visit: Payer: Self-pay | Admitting: Family

## 2021-12-19 DIAGNOSIS — E039 Hypothyroidism, unspecified: Secondary | ICD-10-CM

## 2022-04-11 ENCOUNTER — Telehealth: Payer: Self-pay | Admitting: Family

## 2022-04-11 DIAGNOSIS — F419 Anxiety disorder, unspecified: Secondary | ICD-10-CM

## 2022-04-11 DIAGNOSIS — Z79899 Other long term (current) drug therapy: Secondary | ICD-10-CM

## 2022-04-13 NOTE — Telephone Encounter (Signed)
Pt not due to come back to see PCP for med refill until next month. Pt is already scheduled but pharmacy says Rx is expired and needs another Rx sent in. Pt says she did not fill the Rx every month so that's probably why pharmacy is saying that.   Please advise.

## 2022-05-13 IMAGING — MG DIGITAL SCREENING BILAT W/ TOMO W/ CAD
6 of 10 series · 6 of 30 positions shown · non-contrast
Comparison: Previous exam(s).

CLINICAL DATA: Screening.

EXAM:
DIGITAL SCREENING BILATERAL MAMMOGRAM WITH TOMO AND CAD

[L CC synth-2D]
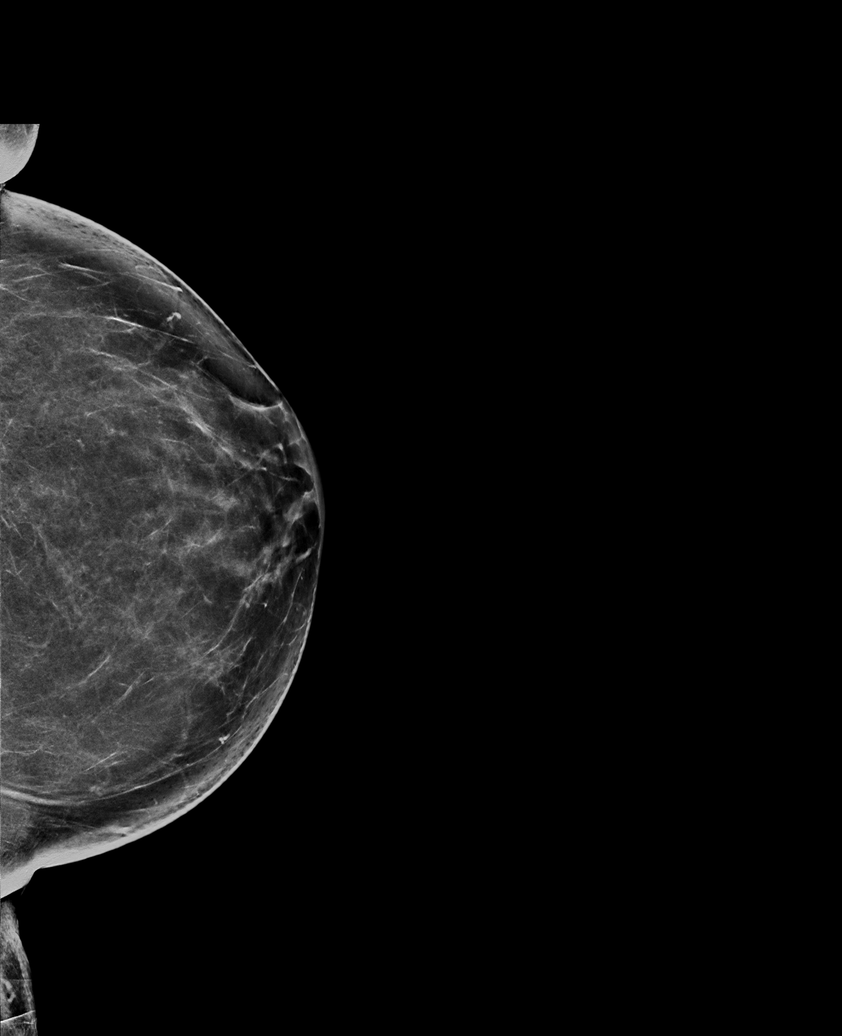

[R MLO synth-2D]
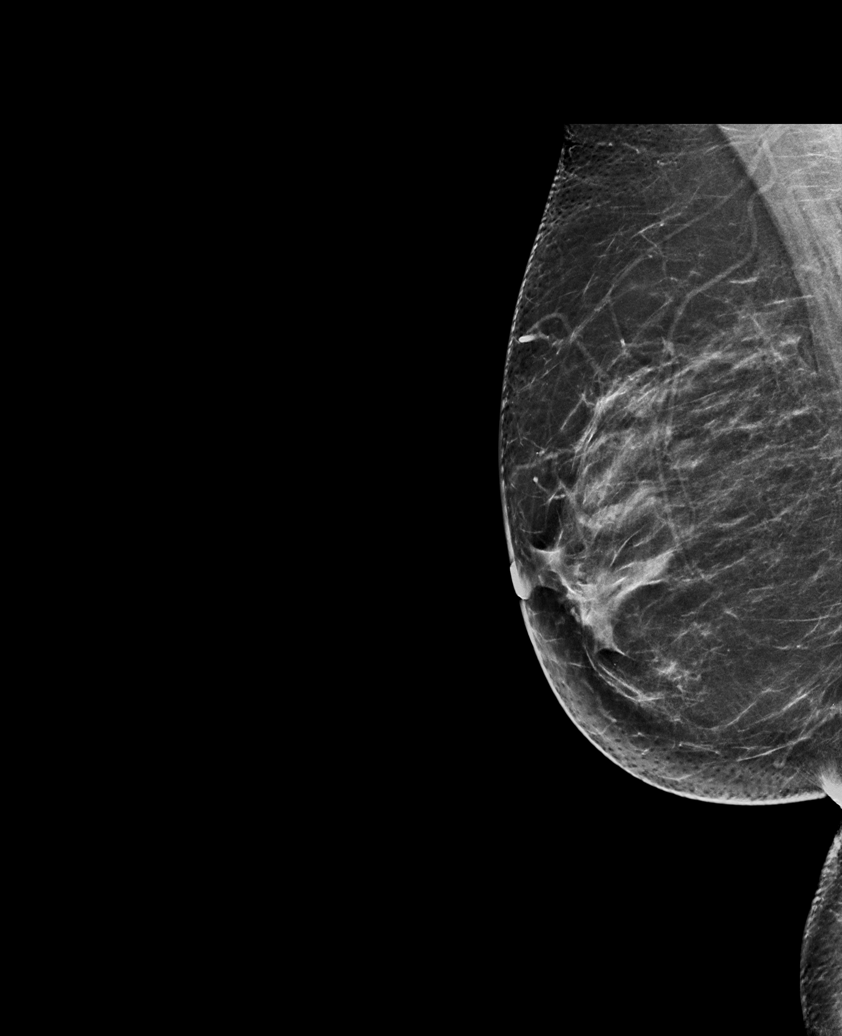

[R CC synth-2D]
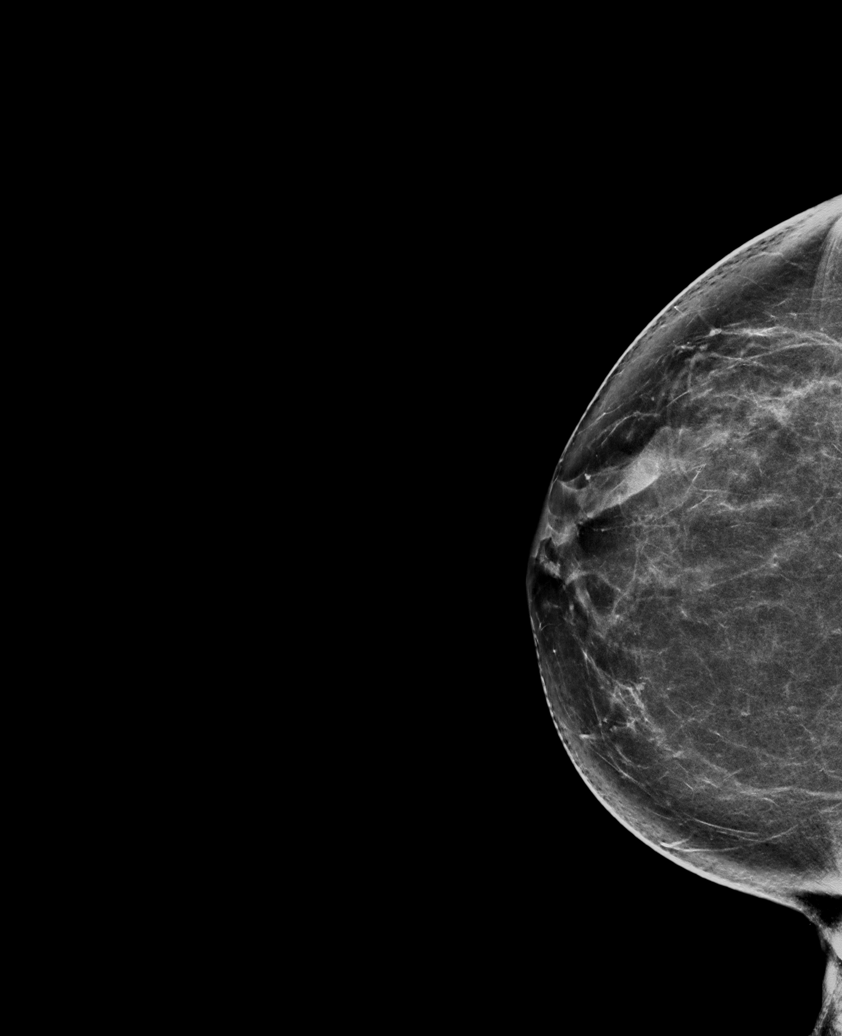

[L MLO synth-2D (1 of 2)]
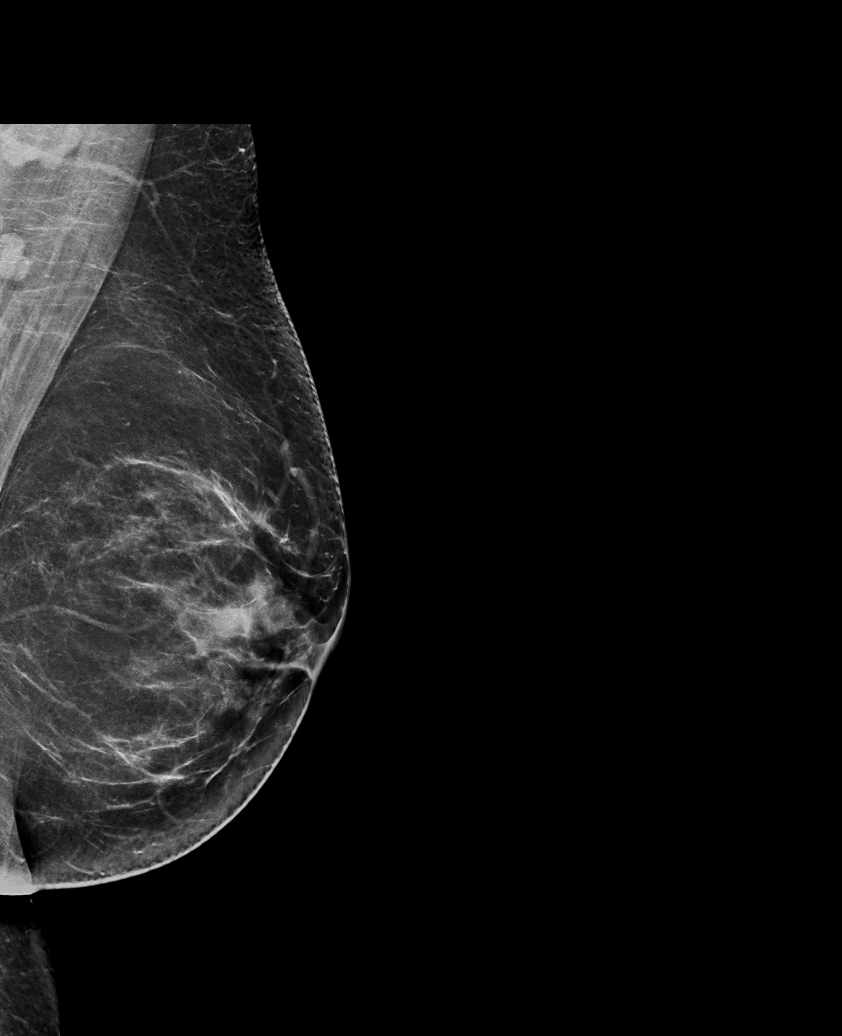

[L MLO synth-2D (2 of 2)]
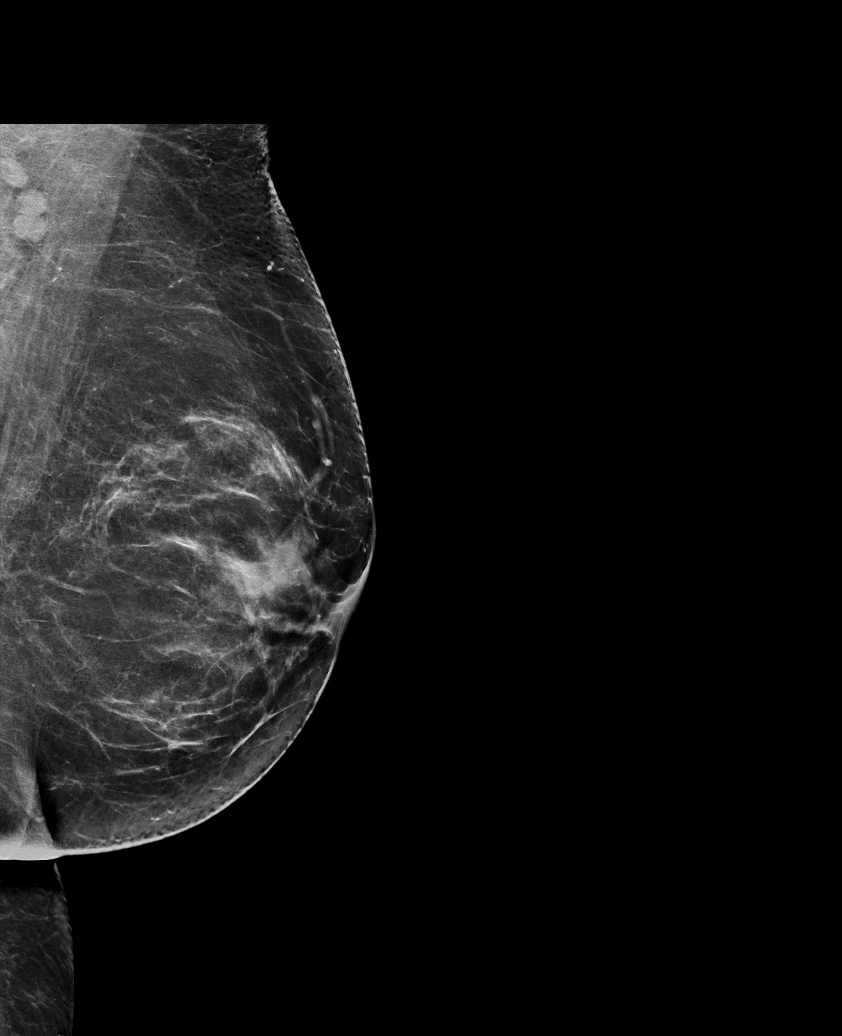

[L MLO tomo · tomo slice 42/83.0]
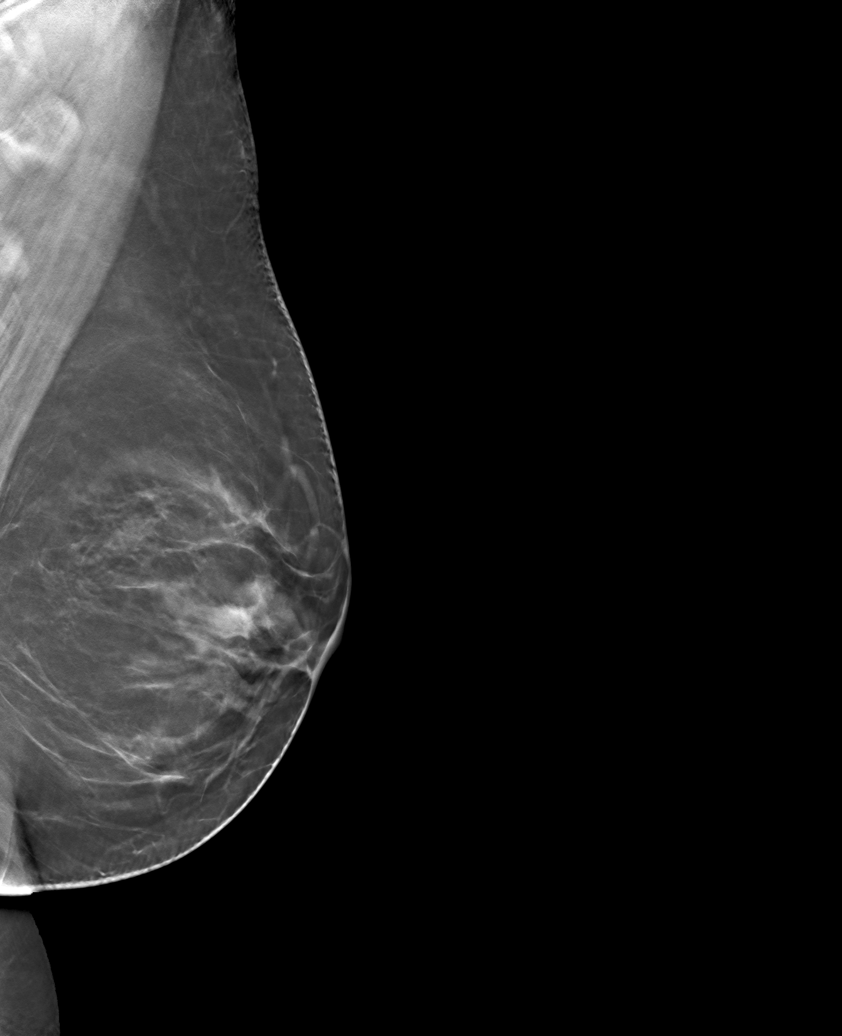

[6 of 30 positions shown; findings below may reference images not displayed]

ACR Breast Density Category b: There are scattered areas of
fibroglandular density.
FINDINGS: There are no findings suspicious for malignancy. Images were
processed with CAD.
IMPRESSION: No mammographic evidence of malignancy. A result letter of this
screening mammogram will be mailed directly to the patient.

RECOMMENDATION:
Screening mammogram in one year. (Code:[id number])

BI-RADS CATEGORY  1: Negative.

## 2022-05-17 ENCOUNTER — Encounter: Payer: Self-pay | Admitting: Family

## 2022-05-17 ENCOUNTER — Ambulatory Visit: Payer: 59 | Admitting: Family

## 2022-05-17 VITALS — BP 139/83 | HR 65 | Temp 97.5°F | Ht 65.0 in | Wt 156.2 lb

## 2022-05-17 DIAGNOSIS — E782 Mixed hyperlipidemia: Secondary | ICD-10-CM

## 2022-05-17 DIAGNOSIS — K219 Gastro-esophageal reflux disease without esophagitis: Secondary | ICD-10-CM

## 2022-05-17 DIAGNOSIS — Z23 Encounter for immunization: Secondary | ICD-10-CM

## 2022-05-17 DIAGNOSIS — F419 Anxiety disorder, unspecified: Secondary | ICD-10-CM | POA: Diagnosis not present

## 2022-05-17 DIAGNOSIS — R232 Flushing: Secondary | ICD-10-CM | POA: Diagnosis not present

## 2022-05-17 DIAGNOSIS — E039 Hypothyroidism, unspecified: Secondary | ICD-10-CM | POA: Diagnosis not present

## 2022-05-17 DIAGNOSIS — Z79899 Other long term (current) drug therapy: Secondary | ICD-10-CM | POA: Diagnosis not present

## 2022-05-17 DIAGNOSIS — I10 Essential (primary) hypertension: Secondary | ICD-10-CM

## 2022-05-17 DIAGNOSIS — G4700 Insomnia, unspecified: Secondary | ICD-10-CM

## 2022-05-17 DIAGNOSIS — M542 Cervicalgia: Secondary | ICD-10-CM

## 2022-05-17 MED ORDER — LISINOPRIL-HYDROCHLOROTHIAZIDE 20-12.5 MG PO TABS: 1.0000 | ORAL_TABLET | Freq: Every day | ORAL | 1 refills | Status: DC

## 2022-05-17 MED ORDER — BACLOFEN 10 MG PO TABS: 10.0000 mg | ORAL_TABLET | Freq: Three times a day (TID) | ORAL | 5 refills | Status: DC

## 2022-05-17 MED ORDER — ROSUVASTATIN CALCIUM 10 MG PO TABS: 10.0000 mg | ORAL_TABLET | Freq: Every day | ORAL | 3 refills | Status: DC

## 2022-05-17 MED ORDER — LEVOTHYROXINE SODIUM 50 MCG PO TABS: 50.0000 ug | ORAL_TABLET | Freq: Every day | ORAL | 2 refills | Status: DC

## 2022-05-17 MED ORDER — DIAZEPAM 5 MG PO TABS
5.0000 mg | ORAL_TABLET | Freq: Two times a day (BID) | ORAL | 3 refills | Status: DC | PRN
Start: 2022-05-17 — End: 2022-08-13

## 2022-05-17 MED ORDER — GABAPENTIN 600 MG PO TABS: 600.0000 mg | ORAL_TABLET | Freq: Every day | ORAL | 1 refills | Status: DC

## 2022-05-17 MED ORDER — TRAZODONE HCL 50 MG PO TABS: 50.0000 mg | ORAL_TABLET | Freq: Every day | ORAL | 2 refills | Status: DC

## 2022-05-17 NOTE — Progress Notes (Signed)
Subjective:    Patient ID: Pamela Santiago, female    DOB: [birth year], 61 y.o.   MRN: [id number]  Chief Complaint  Patient presents with   Medical Management of Chronic Issues   Pt presents to the office today for chronic follow up. She reports she has hot flashes and has been taking gabapentin 600 mg at bedtime that is helping.    She has chronic neck pain that is constant. She has tried other muscle relaxer without success. She takes valium as needed. She only uses Valium 2 mg #20 for 6 months.   She had labs at work 05/09/22.  She found her boyfriend of 32 years dead in 04-29-2023. Has had increased stress with this. Reports she is having a hard time sleeping.  Hypertension This is a chronic problem. The current episode started more than 1 year ago. The problem is controlled. Associated symptoms include neck pain. Pertinent negatives include no malaise/fatigue, peripheral edema or shortness of breath. Risk factors for coronary artery disease include dyslipidemia and sedentary lifestyle. The current treatment provides moderate improvement. Identifiable causes of hypertension include a thyroid problem.  Gastroesophageal Reflux She complains of belching, heartburn and a hoarse voice. This is a chronic problem. The current episode started more than 1 year ago. The problem occurs occasionally. Pertinent negatives include no fatigue. She has tried a PPI for the symptoms. The treatment provided moderate relief.  Thyroid Problem Presents for follow-up visit. Symptoms include hoarse voice. Patient reports no anxiety, diarrhea, fatigue or tremors. The symptoms have been stable. Her past medical history is significant for hyperlipidemia.  Neck Pain  This is a chronic problem. The current episode started more than 1 year ago. The problem occurs intermittently. The problem has been waxing and waning. The quality of the pain is described as aching. The pain is at a severity of 7/10. The pain is mild. The  symptoms are aggravated by twisting. She has tried muscle relaxants for the symptoms. The treatment provided mild relief.  Insomnia Primary symptoms: no sleep disturbance, difficulty falling asleep, frequent awakening, no malaise/fatigue.   The onset quality is gradual. The problem occurs intermittently. Past treatments include medication.  Hyperlipidemia This is a chronic problem. The current episode started more than 1 year ago. The problem is uncontrolled. Exacerbating diseases include hypothyroidism. Pertinent negatives include no shortness of breath. Current antihyperlipidemic treatment includes statins. The current treatment provides moderate improvement of lipids.      Review of Systems  Constitutional:  Negative for fatigue and malaise/fatigue.  HENT:  Positive for hoarse voice.   Respiratory:  Negative for shortness of breath.   Gastrointestinal:  Positive for heartburn. Negative for diarrhea.  Musculoskeletal:  Positive for neck pain.  Neurological:  Negative for tremors.  Psychiatric/Behavioral:  Negative for sleep disturbance. The patient has insomnia. The patient is not nervous/anxious.   All other systems reviewed and are negative.      Objective:   Physical Exam Vitals reviewed.  Constitutional:      General: She is not in acute distress.    Appearance: She is well-developed.  HENT:     Head: Normocephalic and atraumatic.     Right Ear: Tympanic membrane normal.     Left Ear: Tympanic membrane normal.  Eyes:     Pupils: Pupils are equal, round, and reactive to light.  Neck:     Thyroid: No thyromegaly.  Cardiovascular:     Rate and Rhythm: Normal rate and regular rhythm.  Heart sounds: Normal heart sounds. No murmur heard. Pulmonary:     Effort: Pulmonary effort is normal. No respiratory distress.     Breath sounds: Normal breath sounds. No wheezing.  Abdominal:     General: Bowel sounds are normal. There is no distension.     Palpations: Abdomen is soft.      Tenderness: There is no abdominal tenderness.  Musculoskeletal:        General: No tenderness. Normal range of motion.     Cervical back: Normal range of motion and neck supple.  Skin:    General: Skin is warm and dry.  Neurological:     Mental Status: She is alert and oriented to person, place, and time.     Cranial Nerves: No cranial nerve deficit.     Deep Tendon Reflexes: Reflexes are normal and symmetric.  Psychiatric:        Mood and Affect: Affect is tearful.        Behavior: Behavior normal.        Thought Content: Thought content normal.        Judgment: Judgment normal.     BP 139/83   Pulse 65   Temp (!) 97.5 F (36.4 C) (Temporal)   Ht '5\' 5"'$  (1.651 m)   Wt 156 lb 3.2 oz (70.9 kg)   SpO2 95%   BMI 25.99 kg/m      Assessment & Plan:   Pamela Santiago comes in today with chief complaint of Medical Management of Chronic Issues   Diagnosis and orders addressed:  1. Anxiety - diazepam (VALIUM) 5 MG tablet; Take 1 tablet (5 mg total) by mouth every 12 (twelve) hours as needed for anxiety.  Dispense: 20 tablet; Refill: 3  2. Controlled substance agreement signed - diazepam (VALIUM) 5 MG tablet; Take 1 tablet (5 mg total) by mouth every 12 (twelve) hours as needed for anxiety.  Dispense: 20 tablet; Refill: 3 - ToxASSURE Select 13 (MW), Urine  3. Hypothyroidism, unspecified type - levothyroxine (SYNTHROID) 50 MCG tablet; Take 1 tablet (50 mcg total) by mouth daily.  Dispense: 90 tablet; Refill: 2  4. Essential hypertension - lisinopril-hydrochlorothiazide (ZESTORETIC) 20-12.5 MG tablet; Take 1 tablet by mouth daily.  Dispense: 90 tablet; Refill: 1   6. Hot flash not due to menopause - gabapentin (NEURONTIN) 600 MG tablet; Take 1 tablet (600 mg total) by mouth at bedtime.  Dispense: 90 tablet; Refill: 1  7. Gastroesophageal reflux disease, unspecified whether esophagitis present  8. Mixed hyperlipidemia Stop pravastatin and start Crestor 10 mg  -  rosuvastatin (CRESTOR) 10 MG tablet; Take 1 tablet (10 mg total) by mouth daily.  Dispense: 90 tablet; Refill: 3  9. Neck pain Will change Robaxin to Baclofen  - ToxASSURE Select 13 (MW), Urine - baclofen (LIORESAL) 10 MG tablet; Take 1 tablet (10 mg total) by mouth 3 (three) times daily.  Dispense: 90 each; Refill: 5  10. Insomnia, unspecified type Will start trazodone  - traZODone (DESYREL) 50 MG tablet; Take 1-2 tablets (50-100 mg total) by mouth at bedtime.  Dispense: 180 tablet; Refill: 2   Labs pending Health Maintenance reviewed Diet and exercise encouraged  Follow up plan: 3 month to recheck Insomnia, hyperlipidemia, and neck pain   Evelina Dun, FNP

## 2022-05-17 NOTE — Patient Instructions (Signed)
Insomnia Insomnia is a sleep disorder that makes it difficult to fall asleep or stay asleep. Insomnia can cause fatigue, low energy, difficulty concentrating, mood swings, and poor performance at work or school. There are three different ways to classify insomnia: Difficulty falling asleep. Difficulty staying asleep. Waking up too early in the morning. Any type of insomnia can be long-term (chronic) or short-term (acute). Both are common. Short-term insomnia usually lasts for 3 months or less. Chronic insomnia occurs at least three times a week for longer than 3 months. What are the causes? Insomnia may be caused by another condition, situation, or substance, such as: Having certain mental health conditions, such as anxiety and depression. Using caffeine, alcohol, tobacco, or drugs. Having gastrointestinal conditions, such as gastroesophageal reflux disease (GERD). Having certain medical conditions. These include: Asthma. Alzheimer's disease. Stroke. Chronic pain. An overactive thyroid gland (hyperthyroidism). Other sleep disorders, such as restless legs syndrome and sleep apnea. Menopause. Sometimes, the cause of insomnia may not be known. What increases the risk? Risk factors for insomnia include: Gender. Females are affected more often than males. Age. Insomnia is more common as people get older. Stress and certain medical and mental health conditions. Lack of exercise. Having an irregular work schedule. This may include working night shifts and traveling between different time zones. What are the signs or symptoms? If you have insomnia, the main symptom is having trouble falling asleep or having trouble staying asleep. This may lead to other symptoms, such as: Feeling tired or having low energy. Feeling nervous about going to sleep. Not feeling rested in the morning. Having trouble concentrating. Feeling irritable, anxious, or depressed. How is this diagnosed? This condition  may be diagnosed based on: Your symptoms and medical history. Your health care provider may ask about: Your sleep habits. Any medical conditions you have. Your mental health. A physical exam. How is this treated? Treatment for insomnia depends on the cause. Treatment may focus on treating an underlying condition that is causing the insomnia. Treatment may also include: Medicines to help you sleep. Counseling or therapy. Lifestyle adjustments to help you sleep better. Follow these instructions at home: Eating and drinking  Limit or avoid alcohol, caffeinated beverages, and products that contain nicotine and tobacco, especially close to bedtime. These can disrupt your sleep. Do not eat a large meal or eat spicy foods right before bedtime. This can lead to digestive discomfort that can make it hard for you to sleep. Sleep habits  Keep a sleep diary to help you and your health care provider figure out what could be causing your insomnia. Write down: When you sleep. When you wake up during the night. How well you sleep and how rested you feel the next day. Any side effects of medicines you are taking. What you eat and drink. Make your bedroom a dark, comfortable place where it is easy to fall asleep. Put up shades or blackout curtains to block light from outside. Use a white noise machine to block noise. Keep the temperature cool. Limit screen use before bedtime. This includes: Not watching TV. Not using your smartphone, tablet, or computer. Stick to a routine that includes going to bed and waking up at the same times every day and night. This can help you fall asleep faster. Consider making a quiet activity, such as reading, part of your nighttime routine. Try to avoid taking naps during the day so that you sleep better at night. Get out of bed if you are still awake after   15 minutes of trying to sleep. Keep the lights down, but try reading or doing a quiet activity. When you feel  sleepy, go back to bed. General instructions Take over-the-counter and prescription medicines only as told by your health care provider. Exercise regularly as told by your health care provider. However, avoid exercising in the hours right before bedtime. Use relaxation techniques to manage stress. Ask your health care provider to suggest some techniques that may work well for you. These may include: Breathing exercises. Routines to release muscle tension. Visualizing peaceful scenes. Make sure that you drive carefully. Do not drive if you feel very sleepy. Keep all follow-up visits. This is important. Contact a health care provider if: You are tired throughout the day. You have trouble in your daily routine due to sleepiness. You continue to have sleep problems, or your sleep problems get worse. Get help right away if: You have thoughts about hurting yourself or someone else. Get help right away if you feel like you may hurt yourself or others, or have thoughts about taking your own life. Go to your nearest emergency room or: Call 911. Call the National Suicide Prevention Lifeline at 1-800-273-8255 or 988. This is open 24 hours a day. Text the Crisis Text Line at 741741. Summary Insomnia is a sleep disorder that makes it difficult to fall asleep or stay asleep. Insomnia can be long-term (chronic) or short-term (acute). Treatment for insomnia depends on the cause. Treatment may focus on treating an underlying condition that is causing the insomnia. Keep a sleep diary to help you and your health care provider figure out what could be causing your insomnia. This information is not intended to replace advice given to you by your health care provider. Make sure you discuss any questions you have with your health care provider. Document Revised: 08/21/2021 Document Reviewed: 08/21/2021 Elsevier Patient Education  2023 [employer]  

## 2022-05-22 LAB — TOXASSURE SELECT 13 (MW), URINE

## 2022-08-13 ENCOUNTER — Ambulatory Visit: Payer: 59 | Admitting: Family

## 2022-08-13 ENCOUNTER — Encounter: Payer: Self-pay | Admitting: Family

## 2022-08-13 VITALS — BP 122/72 | HR 69 | Temp 98.0°F | Ht 65.0 in | Wt 158.6 lb

## 2022-08-13 DIAGNOSIS — I10 Essential (primary) hypertension: Secondary | ICD-10-CM

## 2022-08-13 DIAGNOSIS — E039 Hypothyroidism, unspecified: Secondary | ICD-10-CM

## 2022-08-13 DIAGNOSIS — Z79899 Other long term (current) drug therapy: Secondary | ICD-10-CM

## 2022-08-13 DIAGNOSIS — M542 Cervicalgia: Secondary | ICD-10-CM | POA: Diagnosis not present

## 2022-08-13 DIAGNOSIS — E782 Mixed hyperlipidemia: Secondary | ICD-10-CM

## 2022-08-13 DIAGNOSIS — R232 Flushing: Secondary | ICD-10-CM | POA: Diagnosis not present

## 2022-08-13 DIAGNOSIS — K219 Gastro-esophageal reflux disease without esophagitis: Secondary | ICD-10-CM

## 2022-08-13 DIAGNOSIS — F419 Anxiety disorder, unspecified: Secondary | ICD-10-CM

## 2022-08-13 MED ORDER — DIAZEPAM 5 MG PO TABS: 5.0000 mg | ORAL_TABLET | Freq: Two times a day (BID) | ORAL | 3 refills | Status: DC | PRN

## 2022-08-13 NOTE — Progress Notes (Signed)
Subjective:    Patient ID: Pamela Santiago, female    DOB: 06-May-1961, 61 y.o.   MRN: [id number]  Chief Complaint  Patient presents with   Medical Management of Chronic Issues   Pt presents to the office today for chronic follow up. She reports she has hot flashes and has been taking gabapentin 600 mg at bedtime that is helping.    She has chronic neck pain that is constant. She has tried other muscle relaxer without success. She takes valium as needed. She only uses Valium 2 mg #20 for 6 months.    She had labs at work 05/09/22.   She found her boyfriend of 32 years dead in April 08, 2023. Has had increased stress with this.  Neck Pain  This is a chronic problem. The current episode started more than 1 year ago. The problem occurs intermittently. The problem has been waxing and waning. The quality of the pain is described as aching. The pain is at a severity of 5/10. The pain is moderate. The symptoms are aggravated by bending and twisting. She has tried muscle relaxants for the symptoms. The treatment provided mild relief.  Hypertension This is a chronic problem. The current episode started more than 1 year ago. The problem has been resolved since onset. The problem is controlled. Associated symptoms include neck pain. Pertinent negatives include no peripheral edema or shortness of breath. Risk factors for coronary artery disease include dyslipidemia and sedentary lifestyle. The current treatment provides moderate improvement. Identifiable causes of hypertension include a thyroid problem.  Gastroesophageal Reflux She complains of belching. She reports no hoarse voice. This is a chronic problem. The current episode started more than 1 year ago. The problem occurs occasionally. Pertinent negatives include no fatigue. Risk factors include obesity. She has tried a PPI for the symptoms. The treatment provided moderate relief.  Thyroid Problem Presents for follow-up visit. Patient reports no constipation,  dry skin, fatigue or hoarse voice. The symptoms have been stable. Her past medical history is significant for hyperlipidemia.  Hyperlipidemia This is a chronic problem. The current episode started more than 1 year ago. The problem is uncontrolled. Recent lipid tests were reviewed and are high. Exacerbating diseases include obesity. Pertinent negatives include no shortness of breath. Current antihyperlipidemic treatment includes statins. The current treatment provides moderate improvement of lipids. Risk factors for coronary artery disease include dyslipidemia, hypertension, a sedentary lifestyle and post-menopausal.      Review of Systems  Constitutional:  Negative for fatigue.  HENT:  Negative for hoarse voice.   Respiratory:  Negative for shortness of breath.   Gastrointestinal:  Negative for constipation.  Musculoskeletal:  Positive for neck pain.  All other systems reviewed and are negative.      Objective:   Physical Exam Vitals reviewed.  Constitutional:      General: She is not in acute distress.    Appearance: She is well-developed.  HENT:     Head: Normocephalic and atraumatic.     Right Ear: Tympanic membrane normal.     Left Ear: Tympanic membrane normal.  Eyes:     Pupils: Pupils are equal, round, and reactive to light.  Neck:     Thyroid: No thyromegaly.  Cardiovascular:     Rate and Rhythm: Normal rate and regular rhythm.     Heart sounds: Normal heart sounds. No murmur heard. Pulmonary:     Effort: Pulmonary effort is normal. No respiratory distress.     Breath sounds: Normal breath sounds. No  wheezing.  Abdominal:     General: Bowel sounds are normal. There is no distension.     Palpations: Abdomen is soft.     Tenderness: There is no abdominal tenderness.  Musculoskeletal:        General: No tenderness. Normal range of motion.     Cervical back: Normal range of motion and neck supple.  Skin:    General: Skin is warm and dry.  Neurological:     Mental  Status: She is alert and oriented to person, place, and time.     Cranial Nerves: No cranial nerve deficit.     Deep Tendon Reflexes: Reflexes are normal and symmetric.  Psychiatric:        Behavior: Behavior normal.        Thought Content: Thought content normal.        Judgment: Judgment normal.       BP 122/72   Pulse 69   Temp 98 F (36.7 C) (Temporal)   Ht '5\' 5"'$  (1.651 m)   Wt 158 lb 9.6 oz (71.9 kg)   SpO2 98%   BMI 26.39 kg/m      Assessment & Plan:   Pamela Santiago comes in today with chief complaint of Medical Management of Chronic Issues   Diagnosis and orders addressed:  1. Neck pain  2. Mixed hyperlipidemia  3. Hypothyroidism, unspecified type  4. Hot flash not due to menopause  5. Gastroesophageal reflux disease, unspecified whether esophagitis present  6. Essential hypertension  7. Controlled substance agreement signed - diazepam (VALIUM) 5 MG tablet; Take 1 tablet (5 mg total) by mouth every 12 (twelve) hours as needed for anxiety.  Dispense: 20 tablet; Refill: 3  8. Anxiety - diazepam (VALIUM) 5 MG tablet; Take 1 tablet (5 mg total) by mouth every 12 (twelve) hours as needed for anxiety.  Dispense: 20 tablet; Refill: 3   Patient reviewed in [place] controlled database, no flags noted. Contract and drug screen are up to date.  Health Maintenance reviewed Diet and exercise encouraged  Follow up plan: 6 months   Evelina Dun, FNP

## 2022-08-13 NOTE — Patient Instructions (Signed)

## 2022-10-16 ENCOUNTER — Other Ambulatory Visit: Payer: Self-pay | Admitting: Family

## 2022-10-22 ENCOUNTER — Other Ambulatory Visit (HOSPITAL_COMMUNITY): Payer: Self-pay | Admitting: Family

## 2022-10-22 DIAGNOSIS — Z1231 Encounter for screening mammogram for malignant neoplasm of breast: Secondary | ICD-10-CM

## 2022-11-09 ENCOUNTER — Ambulatory Visit (HOSPITAL_COMMUNITY)
Admission: RE | Admit: 2022-11-09 | Discharge: 2022-11-09 | Disposition: A | Discharge status: Home / Self Care | Payer: 59 | Source: Ambulatory Visit | Attending: Family | Admitting: Family

## 2022-11-09 ENCOUNTER — Encounter (HOSPITAL_COMMUNITY): Payer: Self-pay

## 2022-11-09 DIAGNOSIS — Z1231 Encounter for screening mammogram for malignant neoplasm of breast: Secondary | ICD-10-CM | POA: Diagnosis present

## 2022-11-30 ENCOUNTER — Other Ambulatory Visit: Payer: Self-pay | Admitting: Family

## 2022-11-30 DIAGNOSIS — R232 Flushing: Secondary | ICD-10-CM

## 2022-12-11 ENCOUNTER — Other Ambulatory Visit: Payer: Self-pay | Admitting: Family

## 2022-12-11 DIAGNOSIS — I10 Essential (primary) hypertension: Secondary | ICD-10-CM

## 2022-12-17 ENCOUNTER — Encounter: Payer: Self-pay | Admitting: *Deleted

## 2023-01-16 ENCOUNTER — Other Ambulatory Visit: Payer: Self-pay | Admitting: Family

## 2023-02-08 ENCOUNTER — Ambulatory Visit: Payer: 59 | Admitting: Family

## 2023-02-15 ENCOUNTER — Ambulatory Visit: Payer: 59 | Admitting: Family

## 2023-02-15 ENCOUNTER — Encounter: Payer: Self-pay | Admitting: Family

## 2023-02-15 VITALS — BP 125/80 | HR 68 | Temp 97.6°F | Ht 65.0 in | Wt 156.4 lb

## 2023-02-15 DIAGNOSIS — M542 Cervicalgia: Secondary | ICD-10-CM

## 2023-02-15 DIAGNOSIS — F419 Anxiety disorder, unspecified: Secondary | ICD-10-CM

## 2023-02-15 DIAGNOSIS — R232 Flushing: Secondary | ICD-10-CM

## 2023-02-15 DIAGNOSIS — Z0001 Encounter for general adult medical examination with abnormal findings: Secondary | ICD-10-CM | POA: Diagnosis not present

## 2023-02-15 DIAGNOSIS — K219 Gastro-esophageal reflux disease without esophagitis: Secondary | ICD-10-CM

## 2023-02-15 DIAGNOSIS — E782 Mixed hyperlipidemia: Secondary | ICD-10-CM

## 2023-02-15 DIAGNOSIS — Z79899 Other long term (current) drug therapy: Secondary | ICD-10-CM | POA: Diagnosis not present

## 2023-02-15 DIAGNOSIS — Z0000 Encounter for general adult medical examination without abnormal findings: Secondary | ICD-10-CM

## 2023-02-15 DIAGNOSIS — I10 Essential (primary) hypertension: Secondary | ICD-10-CM

## 2023-02-15 DIAGNOSIS — E039 Hypothyroidism, unspecified: Secondary | ICD-10-CM

## 2023-02-15 MED ORDER — ROSUVASTATIN CALCIUM 10 MG PO TABS: 10.0000 mg | ORAL_TABLET | Freq: Every day | ORAL | 3 refills | Status: DC

## 2023-02-15 MED ORDER — DIAZEPAM 5 MG PO TABS: 5.0000 mg | ORAL_TABLET | Freq: Two times a day (BID) | ORAL | 3 refills | Status: DC | PRN

## 2023-02-15 MED ORDER — LISINOPRIL-HYDROCHLOROTHIAZIDE 20-12.5 MG PO TABS: 1.0000 | ORAL_TABLET | Freq: Every day | ORAL | 1 refills | Status: DC

## 2023-02-15 MED ORDER — GABAPENTIN 600 MG PO TABS: 600.0000 mg | ORAL_TABLET | Freq: Every day | ORAL | 0 refills | Status: DC

## 2023-02-15 NOTE — Progress Notes (Signed)
Subjective:    Patient ID: Pamela Santiago, female    DOB: [birth year], 62 y.o.   MRN: [id number]  Chief Complaint  Patient presents with   Medical Management of Chronic Issues   Pt presents to the office today for CPE and chronic follow up. She reports she has hot flashes and has been taking gabapentin 600 mg at bedtime that is helping.    She has chronic neck pain that is constant. She has tried other muscle relaxer without success. She takes valium as needed. She only uses Valium 2 mg #20 for 6 months.  Hypertension This is a chronic problem. The current episode started more than 1 year ago. The problem has been resolved since onset. The problem is controlled. Associated symptoms include neck pain. Pertinent negatives include no malaise/fatigue, peripheral edema or shortness of breath. Risk factors for coronary artery disease include dyslipidemia and sedentary lifestyle. The current treatment provides moderate improvement. Identifiable causes of hypertension include a thyroid problem.  Gastroesophageal Reflux She complains of belching and heartburn. This is a chronic problem. The current episode started more than 1 year ago. The problem occurs occasionally. Pertinent negatives include no fatigue. She has tried a PPI for the symptoms. The treatment provided moderate relief.  Thyroid Problem Presents for follow-up visit. Patient reports no anxiety, constipation, dry skin or fatigue. The symptoms have been stable. Her past medical history is significant for hyperlipidemia.  Neck Pain  This is a chronic problem. The current episode started more than 1 year ago. The problem occurs constantly. The problem has been waxing and waning. The pain is present in the right side. The quality of the pain is described as aching. The pain is at a severity of 4/10. The pain is moderate. She has tried home exercises, NSAIDs and muscle relaxants for the symptoms. The treatment provided moderate relief.   Hyperlipidemia This is a chronic problem. The current episode started more than 1 year ago. The problem is controlled. Recent lipid tests were reviewed and are normal. Pertinent negatives include no shortness of breath. Current antihyperlipidemic treatment includes statins. The current treatment provides moderate improvement of lipids. Risk factors for coronary artery disease include dyslipidemia, hypertension, a sedentary lifestyle and post-menopausal.      Review of Systems  Constitutional:  Negative for fatigue and malaise/fatigue.  Respiratory:  Negative for shortness of breath.   Gastrointestinal:  Positive for heartburn. Negative for constipation.  Musculoskeletal:  Positive for neck pain.  Psychiatric/Behavioral:  The patient is not nervous/anxious.   All other systems reviewed and are negative.  Family History  Problem Relation Age of Onset   COPD Mother    Stroke Father    Seizures Sister    Hypertension Sister    Breast cancer Maternal Aunt    Breast cancer Paternal Aunt    Social History   Socioeconomic History   Marital status: Divorced    Spouse name: Not on file   Number of children: Not on file   Years of education: Not on file   Highest education level: Associate degree: occupational, Scientist, product/process development, or vocational program  Occupational History   Not on file  Tobacco Use   Smoking status: Former    Types: Cigarettes    Quit date: 02/05/2002    Years since quitting: 21.0   Smokeless tobacco: Never  Vaping Use   Vaping Use: Never used  Substance and Sexual Activity   Alcohol use: Yes    Comment: occasional   Drug use: No  Sexual activity: Not on file  Other Topics Concern   Not on file  Social History Narrative   Not on file   Social Determinants of Health   Financial Resource Strain: Low Risk  (02/11/2023)   Overall Financial Resource Strain (CARDIA)    Difficulty of Paying Living Expenses: Not hard at all  Food Insecurity: No Food Insecurity  (02/11/2023)   Hunger Vital Sign    Worried About Running Out of Food in the Last Year: Never true    Ran Out of Food in the Last Year: Never true  Transportation Needs: No Transportation Needs (02/11/2023)   PRAPARE - Administrator, Civil Service (Medical): No    Lack of Transportation (Non-Medical): No  Physical Activity: Sufficiently Active (02/11/2023)   Exercise Vital Sign    Days of Exercise per Week: 5 days    Minutes of Exercise per Session: 60 min  Stress: No Stress Concern Present (02/11/2023)   [employer] of Occupational Health - Occupational Stress Questionnaire    Feeling of Stress : Not at all  Social Connections: Moderately Isolated (02/11/2023)   Social Connection and Isolation Panel [NHANES]    Frequency of Communication with Friends and Family: More than three times a week    Frequency of Social Gatherings with Friends and Family: Once a week    Attends Religious Services: More than 4 times per year    Active Member of [employer] or Organizations: No    Attends Engineer, structural: Not on file    Marital Status: Divorced       Objective:   Physical Exam Vitals reviewed.  Constitutional:      General: She is not in acute distress.    Appearance: She is well-developed.  HENT:     Head: Normocephalic and atraumatic.     Right Ear: Tympanic membrane normal.     Left Ear: Tympanic membrane normal.  Eyes:     Pupils: Pupils are equal, round, and reactive to light.  Neck:     Thyroid: No thyromegaly.  Cardiovascular:     Rate and Rhythm: Normal rate and regular rhythm.     Heart sounds: Normal heart sounds. No murmur heard. Pulmonary:     Effort: Pulmonary effort is normal. No respiratory distress.     Breath sounds: Normal breath sounds. No wheezing.  Abdominal:     General: Bowel sounds are normal. There is no distension.     Palpations: Abdomen is soft.     Tenderness: There is no abdominal tenderness.  Musculoskeletal:         General: No tenderness. Normal range of motion.     Cervical back: Normal range of motion and neck supple.  Skin:    General: Skin is warm and dry.  Neurological:     Mental Status: She is alert and oriented to person, place, and time.     Cranial Nerves: No cranial nerve deficit.     Deep Tendon Reflexes: Reflexes are normal and symmetric.  Psychiatric:        Behavior: Behavior normal.        Thought Content: Thought content normal.        Judgment: Judgment normal.       BP 125/80   Pulse 68   Temp 97.6 F (36.4 C) (Temporal)   Ht 5\' 5"  (1.651 m)   Wt 156 lb 6.4 oz (70.9 kg)   SpO2 96%   BMI 26.03 kg/m  Assessment & Plan:  Pamela Santiago comes in today with chief complaint of Medical Management of Chronic Issues   Diagnosis and orders addressed:  1. Anxiety - diazepam (VALIUM) 5 MG tablet; Take 1 tablet (5 mg total) by mouth every 12 (twelve) hours as needed for anxiety.  Dispense: 20 tablet; Refill: 3 - CMP14+EGFR - CBC with Differential/Platelet  2. Controlled substance agreement signed - diazepam (VALIUM) 5 MG tablet; Take 1 tablet (5 mg total) by mouth every 12 (twelve) hours as needed for anxiety.  Dispense: 20 tablet; Refill: 3 - CMP14+EGFR - CBC with Differential/Platelet  3. Hot flash not due to menopause - gabapentin (NEURONTIN) 600 MG tablet; Take 1 tablet (600 mg total) by mouth at bedtime.  Dispense: 90 tablet; Refill: 0 - CMP14+EGFR - CBC with Differential/Platelet  4. Essential hypertension - lisinopril-hydrochlorothiazide (ZESTORETIC) 20-12.5 MG tablet; Take 1 tablet by mouth daily.  Dispense: 90 tablet; Refill: 1 - CMP14+EGFR - CBC with Differential/Platelet  5. Mixed hyperlipidemia - rosuvastatin (CRESTOR) 10 MG tablet; Take 1 tablet (10 mg total) by mouth daily.  Dispense: 90 tablet; Refill: 3 - CMP14+EGFR - CBC with Differential/Platelet - Lipid panel  6. Annual physical exam - CMP14+EGFR - CBC with Differential/Platelet -  Lipid panel - TSH  7. Gastroesophageal reflux disease, unspecified whether esophagitis present - CMP14+EGFR - CBC with Differential/Platelet  8. Hypothyroidism, unspecified type  - CMP14+EGFR - CBC with Differential/Platelet - TSH  9. Neck pain - CMP14+EGFR - CBC with Differential/Platelet   Labs pending Health Maintenance reviewed Diet and exercise encouraged  Follow up plan: 6 months   Jannifer Rodney, FNP

## 2023-02-15 NOTE — Patient Instructions (Signed)

## 2023-02-16 LAB — CBC WITH DIFFERENTIAL/PLATELET
Basophils Absolute: 0 10*3/uL (ref 0.0–0.2)
Basos: 0 %
EOS (ABSOLUTE): 0.1 10*3/uL (ref 0.0–0.4)
Eos: 1 %
Hematocrit: 42.1 % (ref 34.0–46.6)
Hemoglobin: 14.2 g/dL (ref 11.1–15.9)
Immature Grans (Abs): 0 10*3/uL (ref 0.0–0.1)
Immature Granulocytes: 0 %
Lymphocytes Absolute: 2.6 10*3/uL (ref 0.7–3.1)
Lymphs: 37 %
MCH: 32.3 pg (ref 26.6–33.0)
MCHC: 33.7 g/dL (ref 31.5–35.7)
MCV: 96 fL (ref 79–97)
Monocytes Absolute: 0.4 10*3/uL (ref 0.1–0.9)
Monocytes: 6 %
Neutrophils Absolute: 3.8 10*3/uL (ref 1.4–7.0)
Neutrophils: 56 %
Platelets: 229 10*3/uL (ref 150–450)
RBC: 4.39 x10E6/uL (ref 3.77–5.28)
RDW: 12.9 % (ref 11.7–15.4)
WBC: 6.9 10*3/uL (ref 3.4–10.8)

## 2023-02-16 LAB — LIPID PANEL
Chol/HDL Ratio: 2.8 ratio (ref 0.0–4.4)
Cholesterol, Total: 224 mg/dL — ABNORMAL HIGH (ref 100–199)
HDL: 79 mg/dL (ref >39)
LDL Chol Calc (NIH): 132 mg/dL — ABNORMAL HIGH (ref 0–99)
Triglycerides: 76 mg/dL (ref 0–149)
VLDL Cholesterol Cal: 13 mg/dL (ref 5–40)

## 2023-02-16 LAB — CMP14+EGFR
ALT: 17 IU/L (ref 0–32)
AST: 22 IU/L (ref 0–40)
Albumin/Globulin Ratio: 1.8 (ref 1.2–2.2)
Albumin: 4.6 g/dL (ref 3.9–4.9)
Alkaline Phosphatase: 77 IU/L (ref 44–121)
BUN/Creatinine Ratio: 31 — ABNORMAL HIGH (ref 12–28)
BUN: 21 mg/dL (ref 8–27)
Bilirubin Total: 0.5 mg/dL (ref 0.0–1.2)
CO2: 25 mmol/L (ref 20–29)
Calcium: 9.4 mg/dL (ref 8.7–10.3)
Chloride: 98 mmol/L (ref 96–106)
Creatinine, Ser: 0.68 mg/dL (ref 0.57–1.00)
Globulin, Total: 2.5 g/dL (ref 1.5–4.5)
Glucose: 88 mg/dL (ref 70–99)
Potassium: 4.5 mmol/L (ref 3.5–5.2)
Sodium: 138 mmol/L (ref 134–144)
Total Protein: 7.1 g/dL (ref 6.0–8.5)
eGFR: 98 mL/min/{1.73_m2} (ref >59)

## 2023-02-16 LAB — TSH: TSH: 0.859 u[IU]/mL (ref 0.450–4.500)

## 2023-04-16 ENCOUNTER — Other Ambulatory Visit: Payer: Self-pay | Admitting: Family

## 2023-05-31 ENCOUNTER — Other Ambulatory Visit: Payer: Self-pay | Admitting: Family

## 2023-05-31 DIAGNOSIS — R232 Flushing: Secondary | ICD-10-CM

## 2023-06-20 ENCOUNTER — Other Ambulatory Visit: Payer: Self-pay | Admitting: Family

## 2023-06-20 DIAGNOSIS — E039 Hypothyroidism, unspecified: Secondary | ICD-10-CM

## 2023-08-16 ENCOUNTER — Encounter: Payer: Self-pay | Admitting: Family

## 2023-08-16 ENCOUNTER — Ambulatory Visit: Payer: 59 | Admitting: Family

## 2023-08-16 VITALS — BP 118/71 | HR 69 | Temp 97.5°F | Ht <= 58 in | Wt 160.2 lb

## 2023-08-16 DIAGNOSIS — R232 Flushing: Secondary | ICD-10-CM | POA: Diagnosis not present

## 2023-08-16 DIAGNOSIS — I10 Essential (primary) hypertension: Secondary | ICD-10-CM

## 2023-08-16 DIAGNOSIS — F419 Anxiety disorder, unspecified: Secondary | ICD-10-CM

## 2023-08-16 DIAGNOSIS — E039 Hypothyroidism, unspecified: Secondary | ICD-10-CM

## 2023-08-16 DIAGNOSIS — Z79899 Other long term (current) drug therapy: Secondary | ICD-10-CM

## 2023-08-16 DIAGNOSIS — M542 Cervicalgia: Secondary | ICD-10-CM

## 2023-08-16 DIAGNOSIS — E782 Mixed hyperlipidemia: Secondary | ICD-10-CM

## 2023-08-16 DIAGNOSIS — K219 Gastro-esophageal reflux disease without esophagitis: Secondary | ICD-10-CM

## 2023-08-16 MED ORDER — DIAZEPAM 5 MG PO TABS
5.0000 mg | ORAL_TABLET | Freq: Two times a day (BID) | ORAL | 3 refills | Status: DC | PRN
Start: 2023-08-16 — End: 2024-02-21

## 2023-08-16 MED ORDER — ROSUVASTATIN CALCIUM 10 MG PO TABS
10.0000 mg | ORAL_TABLET | Freq: Every day | ORAL | 3 refills | Status: DC
Start: 2023-08-16 — End: 2024-02-21

## 2023-08-16 MED ORDER — LISINOPRIL-HYDROCHLOROTHIAZIDE 20-12.5 MG PO TABS
1.0000 | ORAL_TABLET | Freq: Every day | ORAL | 1 refills | Status: DC
Start: 2023-08-16 — End: 2024-02-21

## 2023-08-16 MED ORDER — GABAPENTIN 600 MG PO TABS
600.0000 mg | ORAL_TABLET | Freq: Every day | ORAL | 0 refills | Status: DC
Start: 2023-08-16 — End: 2023-11-25

## 2023-08-16 NOTE — Progress Notes (Addendum)
Subjective:    Patient ID: Pamela Santiago, female    DOB: 1961/01/08, 62 y.o.   MRN: [id number]  Chief Complaint  Patient presents with   Medical Management of Chronic Issues   Pt presents to the office today for chronic follow up. She reports she has hot flashes and has been taking gabapentin 600 mg at bedtime that is helping.    She has chronic neck pain that is constant. She has tried other muscle relaxer without success. She takes valium as needed. She only uses Valium 2 mg #20 for 6 months.  Hypertension This is a chronic problem. The current episode started more than 1 year ago. The problem has been resolved since onset. The problem is controlled. Associated symptoms include neck pain. Pertinent negatives include no malaise/fatigue, peripheral edema or shortness of breath. Risk factors for coronary artery disease include dyslipidemia and sedentary lifestyle. The current treatment provides moderate improvement. Identifiable causes of hypertension include a thyroid problem.  Gastroesophageal Reflux She complains of belching and heartburn. She reports no hoarse voice. This is a chronic problem. The current episode started more than 1 year ago. The problem occurs occasionally. She has tried a PPI for the symptoms. The treatment provided moderate relief.  Thyroid Problem Presents for follow-up visit. Patient reports no cold intolerance, diarrhea, dry skin or hoarse voice. The symptoms have been stable. Her past medical history is significant for hyperlipidemia.  Hyperlipidemia This is a chronic problem. The current episode started more than 1 year ago. The problem is controlled. Pertinent negatives include no shortness of breath. Current antihyperlipidemic treatment includes statins. The current treatment provides moderate improvement of lipids. Risk factors for coronary artery disease include dyslipidemia and hypertension.  Neck Pain  This is a chronic problem. The current episode started  more than 1 year ago. The problem occurs intermittently. The problem has been waxing and waning. The quality of the pain is described as shooting and aching. The pain is moderate. She has tried muscle relaxants for the symptoms. The treatment provided moderate relief.      Review of Systems  Constitutional:  Negative for malaise/fatigue.  HENT:  Negative for hoarse voice.   Respiratory:  Negative for shortness of breath.   Gastrointestinal:  Positive for heartburn. Negative for diarrhea.  Endocrine: Negative for cold intolerance.  Musculoskeletal:  Positive for neck pain.  All other systems reviewed and are negative.      Objective:   Physical Exam Vitals reviewed.  Constitutional:      General: She is not in acute distress.    Appearance: She is well-developed.  HENT:     Head: Normocephalic and atraumatic.     Right Ear: Tympanic membrane normal.     Left Ear: Tympanic membrane normal.  Eyes:     Pupils: Pupils are equal, round, and reactive to light.  Neck:     Thyroid: No thyromegaly.  Cardiovascular:     Rate and Rhythm: Normal rate and regular rhythm.     Heart sounds: Normal heart sounds. No murmur heard. Pulmonary:     Effort: Pulmonary effort is normal. No respiratory distress.     Breath sounds: Normal breath sounds. No wheezing.  Abdominal:     General: Bowel sounds are normal. There is no distension.     Palpations: Abdomen is soft.     Tenderness: There is no abdominal tenderness.  Musculoskeletal:        General: No tenderness. Normal range of motion.  Cervical back: Normal range of motion and neck supple.     Comments: Full ROM of neck   Skin:    General: Skin is warm and dry.  Neurological:     Mental Status: She is alert and oriented to person, place, and time.     Cranial Nerves: No cranial nerve deficit.     Deep Tendon Reflexes: Reflexes are normal and symmetric.  Psychiatric:        Behavior: Behavior normal.        Thought Content: Thought  content normal.        Judgment: Judgment normal.       BP 118/71   Pulse 69   Temp (!) 97.5 F (36.4 C) (Temporal)   Ht 2' (0.61 m)   Wt 160 lb 3.2 oz (72.7 kg)   SpO2 97%   BMI 195.54 kg/m      Assessment & Plan:  MILIANNA Santiago comes in today with chief complaint of Medical Management of Chronic Issues   Diagnosis and orders addressed:  1. Anxiety - diazepam (VALIUM) 5 MG tablet; Take 1 tablet (5 mg total) by mouth every 12 (twelve) hours as needed for anxiety.  Dispense: 20 tablet; Refill: 3  2. Controlled substance agreement signed - diazepam (VALIUM) 5 MG tablet; Take 1 tablet (5 mg total) by mouth every 12 (twelve) hours as needed for anxiety.  Dispense: 20 tablet; Refill: 3 - ToxASSURE Select 13 (MW), Urine  3. Hot flash not due to menopause - gabapentin (NEURONTIN) 600 MG tablet; Take 1 tablet (600 mg total) by mouth at bedtime.  Dispense: 90 tablet; Refill: 0  4. Essential hypertension - lisinopril-hydrochlorothiazide (ZESTORETIC) 20-12.5 MG tablet; Take 1 tablet by mouth daily.  Dispense: 90 tablet; Refill: 1  5. Mixed hyperlipidemia - rosuvastatin (CRESTOR) 10 MG tablet; Take 1 tablet (10 mg total) by mouth daily.  Dispense: 90 tablet; Refill: 3  6. Hypothyroidism, unspecified type  7. Gastroesophageal reflux disease, unspecified whether esophagitis present  8. Neck pain - ToxASSURE Select 13 (MW), Urine   Will wait for labs until next visit Patient reviewed in [place] controlled database, no flags noted. Contract and drug screen updated today.  Health Maintenance reviewed Diet and exercise encouraged  Follow up plan: 6 months    Jannifer Rodney, FNP

## 2023-08-16 NOTE — Patient Instructions (Signed)

## 2023-08-20 LAB — TOXASSURE SELECT 13 (MW), URINE

## 2023-10-19 ENCOUNTER — Other Ambulatory Visit: Payer: Self-pay | Admitting: Family

## 2023-11-01 ENCOUNTER — Other Ambulatory Visit (HOSPITAL_COMMUNITY): Payer: Self-pay | Admitting: Family

## 2023-11-01 DIAGNOSIS — Z1231 Encounter for screening mammogram for malignant neoplasm of breast: Secondary | ICD-10-CM

## 2023-11-13 ENCOUNTER — Ambulatory Visit (HOSPITAL_COMMUNITY): Payer: 59

## 2023-11-21 ENCOUNTER — Encounter (HOSPITAL_COMMUNITY): Payer: Self-pay

## 2023-11-21 ENCOUNTER — Ambulatory Visit (HOSPITAL_COMMUNITY)
Admission: RE | Admit: 2023-11-21 | Discharge: 2023-11-21 | Disposition: A | Discharge status: Home / Self Care | Payer: BC Managed Care – PPO | Source: Ambulatory Visit | Attending: Family | Admitting: Family

## 2023-11-21 DIAGNOSIS — Z1231 Encounter for screening mammogram for malignant neoplasm of breast: Secondary | ICD-10-CM | POA: Insufficient documentation

## 2023-11-25 ENCOUNTER — Other Ambulatory Visit: Payer: Self-pay | Admitting: Family

## 2023-11-25 DIAGNOSIS — R232 Flushing: Secondary | ICD-10-CM

## 2023-12-13 ENCOUNTER — Other Ambulatory Visit: Payer: Self-pay | Admitting: Family

## 2023-12-13 DIAGNOSIS — E039 Hypothyroidism, unspecified: Secondary | ICD-10-CM

## 2024-01-22 ENCOUNTER — Other Ambulatory Visit: Payer: Self-pay | Admitting: Family Medicine

## 2024-01-22 DIAGNOSIS — E782 Mixed hyperlipidemia: Secondary | ICD-10-CM

## 2024-01-22 DIAGNOSIS — E039 Hypothyroidism, unspecified: Secondary | ICD-10-CM

## 2024-01-22 DIAGNOSIS — I10 Essential (primary) hypertension: Secondary | ICD-10-CM

## 2024-02-14 ENCOUNTER — Other Ambulatory Visit: Payer: Self-pay

## 2024-02-14 DIAGNOSIS — I10 Essential (primary) hypertension: Secondary | ICD-10-CM

## 2024-02-14 DIAGNOSIS — E039 Hypothyroidism, unspecified: Secondary | ICD-10-CM

## 2024-02-14 DIAGNOSIS — E782 Mixed hyperlipidemia: Secondary | ICD-10-CM

## 2024-02-14 LAB — LIPID PANEL
Chol/HDL Ratio: 3 ratio (ref 0.0–4.4)
Cholesterol, Total: 198 mg/dL (ref 100–199)
HDL: 65 mg/dL (ref >39)
LDL Chol Calc (NIH): 119 mg/dL — ABNORMAL HIGH (ref 0–99)
Triglycerides: 76 mg/dL (ref 0–149)
VLDL Cholesterol Cal: 14 mg/dL (ref 5–40)

## 2024-02-14 LAB — CBC WITH DIFFERENTIAL/PLATELET
Basophils Absolute: 0 10*3/uL (ref 0.0–0.2)
Basos: 1 %
EOS (ABSOLUTE): 0.1 10*3/uL (ref 0.0–0.4)
Eos: 1 %
Hematocrit: 41.2 % (ref 34.0–46.6)
Hemoglobin: 13.3 g/dL (ref 11.1–15.9)
Immature Grans (Abs): 0 10*3/uL (ref 0.0–0.1)
Immature Granulocytes: 0 %
Lymphocytes Absolute: 2.5 10*3/uL (ref 0.7–3.1)
Lymphs: 34 %
MCH: 30.5 pg (ref 26.6–33.0)
MCHC: 32.3 g/dL (ref 31.5–35.7)
MCV: 95 fL (ref 79–97)
Monocytes Absolute: 0.4 10*3/uL (ref 0.1–0.9)
Monocytes: 5 %
Neutrophils Absolute: 4.3 10*3/uL (ref 1.4–7.0)
Neutrophils: 59 %
Platelets: 250 10*3/uL (ref 150–450)
RBC: 4.36 x10E6/uL (ref 3.77–5.28)
RDW: 12.3 % (ref 11.7–15.4)
WBC: 7.2 10*3/uL (ref 3.4–10.8)

## 2024-02-14 LAB — CMP14+EGFR
ALT: 18 IU/L (ref 0–32)
AST: 24 IU/L (ref 0–40)
Albumin: 4.4 g/dL (ref 3.9–4.9)
Alkaline Phosphatase: 71 IU/L (ref 44–121)
BUN/Creatinine Ratio: 21 (ref 12–28)
BUN: 13 mg/dL (ref 8–27)
Bilirubin Total: 0.5 mg/dL (ref 0.0–1.2)
CO2: 24 mmol/L (ref 20–29)
Calcium: 9.8 mg/dL (ref 8.7–10.3)
Chloride: 101 mmol/L (ref 96–106)
Creatinine, Ser: 0.61 mg/dL (ref 0.57–1.00)
Globulin, Total: 2.7 g/dL (ref 1.5–4.5)
Glucose: 83 mg/dL (ref 70–99)
Potassium: 4.3 mmol/L (ref 3.5–5.2)
Sodium: 140 mmol/L (ref 134–144)
Total Protein: 7.1 g/dL (ref 6.0–8.5)
eGFR: 100 mL/min/{1.73_m2} (ref >59)

## 2024-02-14 LAB — TSH: TSH: 1.12 u[IU]/mL (ref 0.450–4.500)

## 2024-02-18 ENCOUNTER — Ambulatory Visit: Payer: Self-pay | Admitting: Family

## 2024-02-21 ENCOUNTER — Encounter: Payer: Self-pay | Admitting: Family

## 2024-02-21 ENCOUNTER — Ambulatory Visit: Payer: 59 | Admitting: Family

## 2024-02-21 VITALS — BP 114/68 | HR 58 | Temp 97.5°F | Ht 65.0 in | Wt 154.8 lb

## 2024-02-21 DIAGNOSIS — K219 Gastro-esophageal reflux disease without esophagitis: Secondary | ICD-10-CM

## 2024-02-21 DIAGNOSIS — F419 Anxiety disorder, unspecified: Secondary | ICD-10-CM | POA: Diagnosis not present

## 2024-02-21 DIAGNOSIS — Z0001 Encounter for general adult medical examination with abnormal findings: Secondary | ICD-10-CM | POA: Diagnosis not present

## 2024-02-21 DIAGNOSIS — R232 Flushing: Secondary | ICD-10-CM | POA: Diagnosis not present

## 2024-02-21 DIAGNOSIS — M542 Cervicalgia: Secondary | ICD-10-CM

## 2024-02-21 DIAGNOSIS — E782 Mixed hyperlipidemia: Secondary | ICD-10-CM

## 2024-02-21 DIAGNOSIS — E039 Hypothyroidism, unspecified: Secondary | ICD-10-CM

## 2024-02-21 DIAGNOSIS — I10 Essential (primary) hypertension: Secondary | ICD-10-CM

## 2024-02-21 DIAGNOSIS — Z79899 Other long term (current) drug therapy: Secondary | ICD-10-CM | POA: Diagnosis not present

## 2024-02-21 DIAGNOSIS — Z0000 Encounter for general adult medical examination without abnormal findings: Secondary | ICD-10-CM

## 2024-02-21 MED ORDER — LEVOTHYROXINE SODIUM 50 MCG PO TABS: 50.0000 ug | ORAL_TABLET | Freq: Every day | ORAL | 0 refills | Status: DC

## 2024-02-21 MED ORDER — GABAPENTIN 600 MG PO TABS: 600.0000 mg | ORAL_TABLET | Freq: Every day | ORAL | 0 refills | Status: DC

## 2024-02-21 MED ORDER — DIAZEPAM 5 MG PO TABS
5.0000 mg | ORAL_TABLET | Freq: Two times a day (BID) | ORAL | 3 refills | Status: DC | PRN
Start: 2024-02-21 — End: 2024-08-12

## 2024-02-21 MED ORDER — PANTOPRAZOLE SODIUM 20 MG PO TBEC: 20.0000 mg | DELAYED_RELEASE_TABLET | Freq: Every day | ORAL | 1 refills | Status: DC

## 2024-02-21 MED ORDER — LISINOPRIL-HYDROCHLOROTHIAZIDE 20-12.5 MG PO TABS: 1.0000 | ORAL_TABLET | Freq: Every day | ORAL | 1 refills | Status: DC

## 2024-02-21 MED ORDER — CLOBETASOL PROPIONATE 0.05 % EX CREA: TOPICAL_CREAM | Freq: Two times a day (BID) | CUTANEOUS | 11 refills | Status: AC

## 2024-02-21 MED ORDER — ROSUVASTATIN CALCIUM 10 MG PO TABS
10.0000 mg | ORAL_TABLET | Freq: Every day | ORAL | 3 refills | Status: AC
Start: 2024-02-21 — End: ?

## 2024-02-21 NOTE — Progress Notes (Signed)
 Subjective:    Patient ID: Pamela Santiago, female    DOB: Dec 19, 1960, 63 y.o.   MRN: [id number]  Chief Complaint  Patient presents with   Medical Management of Chronic Issues   Pt presents to the office today for CPE and  chronic follow up. She reports she has hot flashes and has been taking gabapentin  600 mg at bedtime that is helping.    She has chronic neck pain that is constant. She has tried other muscle relaxer without success. She takes valium  as needed. She only uses Valium  2 mg #20 for 6 months.  Hypertension This is a chronic problem. The current episode started more than 1 year ago. The problem has been resolved since onset. The problem is controlled. Associated symptoms include neck pain. Pertinent negatives include no malaise/fatigue, peripheral edema or shortness of breath. Risk factors for coronary artery disease include dyslipidemia and sedentary lifestyle. The current treatment provides moderate improvement. Identifiable causes of hypertension include a thyroid  problem.  Gastroesophageal Reflux She complains of belching and heartburn. She reports no hoarse voice. This is a chronic problem. The current episode started more than 1 year ago. The problem occurs occasionally. Pertinent negatives include no fatigue. She has tried a PPI for the symptoms. The treatment provided moderate relief.  Thyroid  Problem Presents for follow-up visit. Patient reports no cold intolerance, diarrhea, dry skin, fatigue or hoarse voice. The symptoms have been stable.  Hyperlipidemia This is a chronic problem. The current episode started more than 1 year ago. The problem is uncontrolled. Recent lipid tests were reviewed and are high. Pertinent negatives include no shortness of breath. Current antihyperlipidemic treatment includes statins. The current treatment provides moderate improvement of lipids. Risk factors for coronary artery disease include dyslipidemia and hypertension.  Neck Pain  This is a  chronic problem. The current episode started more than 1 year ago. The problem occurs intermittently. The problem has been waxing and waning. The quality of the pain is described as shooting and aching. The pain is at a severity of 5/10. The pain is moderate. She has tried muscle relaxants for the symptoms. The treatment provided moderate relief.      Review of Systems  Constitutional:  Negative for fatigue and malaise/fatigue.  HENT:  Negative for hoarse voice.   Respiratory:  Negative for shortness of breath.   Gastrointestinal:  Positive for heartburn. Negative for diarrhea.  Endocrine: Negative for cold intolerance.  Musculoskeletal:  Positive for neck pain.  All other systems reviewed and are negative.  Family History  Problem Relation Age of Onset   COPD Mother    Stroke Father    Seizures Sister    Hypertension Sister    Breast cancer Maternal Aunt    Breast cancer Paternal Aunt    Social History   Socioeconomic History   Marital status: Divorced    Spouse name: Not on file   Number of children: Not on file   Years of education: Not on file   Highest education level: Associate degree: occupational, Scientist, product/process development, or vocational program  Occupational History   Not on file  Tobacco Use   Smoking status: Former    Current packs/day: 0.00    Types: Cigarettes    Quit date: 02/05/2002    Years since quitting: 22.0   Smokeless tobacco: Never  Vaping Use   Vaping status: Never Used  Substance and Sexual Activity   Alcohol use: Yes    Comment: occasional   Drug use: No  Sexual activity: Not on file  Other Topics Concern   Not on file  Social History Narrative   Not on file   Social Drivers of Health   Financial Resource Strain: Low Risk  (02/19/2024)   Overall Financial Resource Strain (CARDIA)    Difficulty of Paying Living Expenses: Not very hard  Food Insecurity: No Food Insecurity (02/19/2024)   Hunger Vital Sign    Worried About Running Out of Food in the Last  Year: Never true    Ran Out of Food in the Last Year: Never true  Transportation Needs: No Transportation Needs (02/19/2024)   PRAPARE - Administrator, Civil Service (Medical): No    Lack of Transportation (Non-Medical): No  Physical Activity: Sufficiently Active (02/19/2024)   Exercise Vital Sign    Days of Exercise per Week: 6 days    Minutes of Exercise per Session: 40 min  Stress: No Stress Concern Present (02/19/2024)   [employer] of Occupational Health - Occupational Stress Questionnaire    Feeling of Stress : Not at all  Social Connections: Moderately Integrated (02/19/2024)   Social Connection and Isolation Panel [NHANES]    Frequency of Communication with Friends and Family: Three times a week    Frequency of Social Gatherings with Friends and Family: Three times a week    Attends Religious Services: More than 4 times per year    Active Member of Clubs or Organizations: Yes    Attends Banker Meetings: More than 4 times per year    Marital Status: Widowed        Objective:   Physical Exam Vitals reviewed.  Constitutional:      General: She is not in acute distress.    Appearance: She is well-developed.  HENT:     Head: Normocephalic and atraumatic.     Right Ear: Tympanic membrane normal.     Left Ear: Tympanic membrane normal.  Eyes:     Pupils: Pupils are equal, round, and reactive to light.  Neck:     Thyroid : No thyromegaly.  Cardiovascular:     Rate and Rhythm: Normal rate and regular rhythm.     Heart sounds: Normal heart sounds. No murmur heard. Pulmonary:     Effort: Pulmonary effort is normal. No respiratory distress.     Breath sounds: Normal breath sounds. No wheezing.  Abdominal:     General: Bowel sounds are normal. There is no distension.     Palpations: Abdomen is soft.     Tenderness: There is no abdominal tenderness.  Musculoskeletal:        General: No tenderness. Normal range of motion.     Cervical back:  Normal range of motion and neck supple.     Comments: Full ROM of neck   Skin:    General: Skin is warm and dry.  Neurological:     Mental Status: She is alert and oriented to person, place, and time.     Cranial Nerves: No cranial nerve deficit.     Deep Tendon Reflexes: Reflexes are normal and symmetric.  Psychiatric:        Behavior: Behavior normal.        Thought Content: Thought content normal.        Judgment: Judgment normal.       BP 114/68   Pulse (!) 58   Temp (!) 97.5 F (36.4 C) (Temporal)   Ht 5\' 5"  (1.651 m)   Wt 154 lb 12.8 oz (  70.2 kg)   BMI 25.76 kg/m      Assessment & Plan:  NORVELLA LOSCALZO comes in today with chief complaint of Medical Management of Chronic Issues   Diagnosis and orders addressed:  1. Anxiety - diazepam  (VALIUM ) 5 MG tablet; Take 1 tablet (5 mg total) by mouth every 12 (twelve) hours as needed for anxiety.  Dispense: 20 tablet; Refill: 3  2. Controlled substance agreement signed - diazepam  (VALIUM ) 5 MG tablet; Take 1 tablet (5 mg total) by mouth every 12 (twelve) hours as needed for anxiety.  Dispense: 20 tablet; Refill: 3  3. 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  Dispense: 90 tablet; Refill: 0  4. Hypothyroidism, unspecified type - levothyroxine  (SYNTHROID ) 50 MCG tablet; Take 1 tablet (50 mcg total) by mouth daily.  Dispense: 90 tablet; Refill: 0  5. Essential hypertension - lisinopril -hydrochlorothiazide  (ZESTORETIC ) 20-12.5 MG tablet; Take 1 tablet by mouth daily.  Dispense: 90 tablet; Refill: 1  6. Mixed hyperlipidemia - rosuvastatin  (CRESTOR ) 10 MG tablet; Take 1 tablet (10 mg total) by mouth daily.  Dispense: 90 tablet; Refill: 3  7. Annual physical exam (Primary)  8. Gastroesophageal reflux disease, unspecified whether esophagitis present  - pantoprazole  (PROTONIX ) 20 MG tablet; Take 1 tablet (20 mg total) by mouth daily.  Dispense:  90 tablet; Refill: 1  9. Neck pain    Labs reviewed Patient reviewed in [place] controlled database, no flags noted. Contract and drug screen updated today.  Health Maintenance reviewed Diet and exercise encouraged  Follow up plan: 6 months    Tommas Fragmin, FNP

## 2024-02-21 NOTE — Patient Instructions (Signed)

## 2024-05-27 ENCOUNTER — Other Ambulatory Visit: Payer: Self-pay | Admitting: Family

## 2024-05-27 DIAGNOSIS — R232 Flushing: Secondary | ICD-10-CM

## 2024-06-11 ENCOUNTER — Other Ambulatory Visit: Payer: Self-pay | Admitting: Family

## 2024-06-11 DIAGNOSIS — E039 Hypothyroidism, unspecified: Secondary | ICD-10-CM

## 2024-07-03 ENCOUNTER — Telehealth: Payer: Self-pay | Admitting: Family Medicine

## 2024-07-03 NOTE — Telephone Encounter (Signed)
 Called patient changed to pap

## 2024-07-03 NOTE — Telephone Encounter (Signed)
 Copied from CRM [id number]. Topic: Clinical - Medical Advice >> Jul 03, 2024  8:36 AM Delon HERO wrote: Reason for CRM: Patient is calling to speak with [facility] nurse. Next appointment 08/14/24 for 6 month follow up. Can she get PAP at this time.  Please advise   No PAP during 02/21/24 CPE.   Was advised need one more PAP since she is age 64.  Flu shot date 07/03/2024

## 2024-08-14 ENCOUNTER — Other Ambulatory Visit (HOSPITAL_COMMUNITY)
Admission: RE | Admit: 2024-08-14 | Discharge: 2024-08-14 | Disposition: A | Source: Ambulatory Visit | Attending: Family | Admitting: Family

## 2024-08-14 ENCOUNTER — Ambulatory Visit (INDEPENDENT_AMBULATORY_CARE_PROVIDER_SITE_OTHER): Payer: Self-pay | Admitting: Family

## 2024-08-14 ENCOUNTER — Encounter: Payer: Self-pay | Admitting: Family

## 2024-08-14 VITALS — BP 126/85 | HR 63 | Temp 98.1°F | Ht 65.0 in | Wt 156.4 lb

## 2024-08-14 DIAGNOSIS — Z01419 Encounter for gynecological examination (general) (routine) without abnormal findings: Secondary | ICD-10-CM

## 2024-08-14 DIAGNOSIS — K219 Gastro-esophageal reflux disease without esophagitis: Secondary | ICD-10-CM

## 2024-08-14 DIAGNOSIS — F419 Anxiety disorder, unspecified: Secondary | ICD-10-CM | POA: Diagnosis not present

## 2024-08-14 DIAGNOSIS — Z79899 Other long term (current) drug therapy: Secondary | ICD-10-CM

## 2024-08-14 DIAGNOSIS — Z01411 Encounter for gynecological examination (general) (routine) with abnormal findings: Secondary | ICD-10-CM

## 2024-08-14 DIAGNOSIS — Z23 Encounter for immunization: Secondary | ICD-10-CM

## 2024-08-14 DIAGNOSIS — E039 Hypothyroidism, unspecified: Secondary | ICD-10-CM | POA: Diagnosis not present

## 2024-08-14 DIAGNOSIS — M542 Cervicalgia: Secondary | ICD-10-CM

## 2024-08-14 DIAGNOSIS — R232 Flushing: Secondary | ICD-10-CM

## 2024-08-14 DIAGNOSIS — E782 Mixed hyperlipidemia: Secondary | ICD-10-CM

## 2024-08-14 DIAGNOSIS — I10 Essential (primary) hypertension: Secondary | ICD-10-CM

## 2024-08-14 MED ORDER — DIAZEPAM 5 MG PO TABS: 5.0000 mg | ORAL_TABLET | Freq: Two times a day (BID) | ORAL | 3 refills | Status: AC | PRN

## 2024-08-14 MED ORDER — PANTOPRAZOLE SODIUM 20 MG PO TBEC: 20.0000 mg | DELAYED_RELEASE_TABLET | Freq: Every day | ORAL | 1 refills | Status: AC

## 2024-08-14 MED ORDER — GABAPENTIN 600 MG PO TABS: 600.0000 mg | ORAL_TABLET | Freq: Every day | ORAL | 0 refills | Status: AC

## 2024-08-14 MED ORDER — LISINOPRIL-HYDROCHLOROTHIAZIDE 20-12.5 MG PO TABS: 1.0000 | ORAL_TABLET | Freq: Every day | ORAL | 1 refills | Status: AC

## 2024-08-14 NOTE — Addendum Note (Signed)
 Addended by: MICHELINE ROSINA FALCON on: 08/14/2024 09:40 AM   Modules accepted: Orders

## 2024-08-14 NOTE — Addendum Note (Signed)
 Addended by: LAVELL LYE A on: 08/14/2024 10:42 AM   Modules accepted: Level of Service

## 2024-08-14 NOTE — Patient Instructions (Signed)
 Health Maintenance After Age 65 After age 27, you are at a higher risk for certain long-term diseases and infections as well as injuries from falls. Falls are a major cause of broken bones and head injuries in people who are older than age 73. Getting regular preventive care can help to keep you healthy and well. Preventive care includes getting regular testing and making lifestyle changes as recommended by your health care provider. Talk with your health care provider about: Which screenings and tests you should have. A screening is a test that checks for a disease when you have no symptoms. A diet and exercise plan that is right for you. What should I know about screenings and tests to prevent falls? Screening and testing are the best ways to find a health problem early. Early diagnosis and treatment give you the best chance of managing medical conditions that are common after age 90. Certain conditions and lifestyle choices may make you more likely to have a fall. Your health care provider may recommend: Regular vision checks. Poor vision and conditions such as cataracts can make you more likely to have a fall. If you wear glasses, make sure to get your prescription updated if your vision changes. Medicine review. Work with your health care provider to regularly review all of the medicines you are taking, including over-the-counter medicines. Ask your health care provider about any side effects that may make you more likely to have a fall. Tell your health care provider if any medicines that you take make you feel dizzy or sleepy. Strength and balance checks. Your health care provider may recommend certain tests to check your strength and balance while standing, walking, or changing positions. Foot health exam. Foot pain and numbness, as well as not wearing proper footwear, can make you more likely to have a fall. Screenings, including: Osteoporosis screening. Osteoporosis is a condition that causes  the bones to get weaker and break more easily. Blood pressure screening. Blood pressure changes and medicines to control blood pressure can make you feel dizzy. Depression screening. You may be more likely to have a fall if you have a fear of falling, feel depressed, or feel unable to do activities that you used to do. Alcohol  use screening. Using too much alcohol  can affect your balance and may make you more likely to have a fall. Follow these instructions at home: Lifestyle Do not drink alcohol  if: Your health care provider tells you not to drink. If you drink alcohol : Limit how much you have to: 0-1 drink a day for women. 0-2 drinks a day for men. Know how much alcohol  is in your drink. In the [place] one drink equals one 12 oz bottle of beer (355 mL), one 5 oz glass of wine (148 mL), or one 1 oz glass of hard liquor (44 mL). Do not use any products that contain nicotine or tobacco. These products include cigarettes, chewing tobacco, and vaping devices, such as e-cigarettes. If you need help quitting, ask your health care provider. Activity  Follow a regular exercise program to stay fit. This will help you maintain your balance. Ask your health care provider what types of exercise are appropriate for you. If you need a cane or walker, use it as recommended by your health care provider. Wear supportive shoes that have nonskid soles. Safety  Remove any tripping hazards, such as rugs, cords, and clutter. Install safety equipment such as grab bars in bathrooms and safety rails on stairs. Keep rooms and walkways  well-lit. General instructions Talk with your health care provider about your risks for falling. Tell your health care provider if: You fall. Be sure to tell your health care provider about all falls, even ones that seem minor. You feel dizzy, tiredness (fatigue), or off-balance. Take over-the-counter and prescription medicines only as told by your health care provider. These include  supplements. Eat a healthy diet and maintain a healthy weight. A healthy diet includes low-fat dairy products, low-fat (lean) meats, and fiber from whole grains, beans, and lots of fruits and vegetables. Stay current with your vaccines. Schedule regular health, dental, and eye exams. Summary Having a healthy lifestyle and getting preventive care can help to protect your health and wellness after age 15. Screening and testing are the best way to find a health problem early and help you avoid having a fall. Early diagnosis and treatment give you the best chance for managing medical conditions that are more common for people who are older than age 42. Falls are a major cause of broken bones and head injuries in people who are older than age 64. Take precautions to prevent a fall at home. Work with your health care provider to learn what changes you can make to improve your health and wellness and to prevent falls. This information is not intended to replace advice given to you by your health care provider. Make sure you discuss any questions you have with your health care provider. Document Revised: 01/30/2021 Document Reviewed: 01/30/2021 Elsevier Patient Education  2024 [employer].

## 2024-08-14 NOTE — Progress Notes (Signed)
 Subjective:    Patient ID: Pamela Santiago, female    DOB: September 10, 1961, 63 y.o.   MRN: [id number]  Chief Complaint  Patient presents with   Medical Management of Chronic Issues   Gynecologic Exam   Pt presents to the office today for pap and  chronic follow up. She reports she has hot flashes and has been taking gabapentin  600 mg at bedtime that is helping.    She has chronic neck pain that is constant. She has tried other muscle relaxer without success. She takes valium  as needed. She only uses Valium  2 mg #20 for 6 months.  Hypertension This is a chronic problem. The current episode started more than 1 year ago. The problem has been resolved since onset. The problem is controlled. Associated symptoms include neck pain. Pertinent negatives include no malaise/fatigue, peripheral edema or shortness of breath. Risk factors for coronary artery disease include dyslipidemia and sedentary lifestyle. The current treatment provides moderate improvement. Identifiable causes of hypertension include a thyroid  problem.  Gastroesophageal Reflux She complains of belching and heartburn. She reports no hoarse voice. This is a chronic problem. The current episode started more than 1 year ago. The problem occurs occasionally. The symptoms are aggravated by certain foods. Pertinent negatives include no fatigue. She has tried a PPI for the symptoms. The treatment provided moderate relief.  Thyroid  Problem Presents for follow-up visit. Patient reports no cold intolerance, diarrhea, dry skin, fatigue or hoarse voice. The symptoms have been stable.  Hyperlipidemia This is a chronic problem. The current episode started more than 1 year ago. The problem is uncontrolled. Recent lipid tests were reviewed and are high. Pertinent negatives include no shortness of breath. Current antihyperlipidemic treatment includes statins. The current treatment provides moderate improvement of lipids. Risk factors for coronary artery  disease include dyslipidemia, hypertension and post-menopausal.  Neck Pain  This is a chronic problem. The current episode started more than 1 year ago. The problem occurs intermittently. The problem has been waxing and waning. The quality of the pain is described as shooting and aching. The pain is at a severity of 4/10. The pain is moderate. She has tried muscle relaxants for the symptoms. The treatment provided moderate relief.  Gynecologic Exam The patient's pertinent negatives include no genital itching, genital odor or vaginal discharge. The problem occurs intermittently. Pertinent negatives include no diarrhea.      Review of Systems  Constitutional:  Negative for fatigue and malaise/fatigue.  HENT:  Negative for hoarse voice.   Respiratory:  Negative for shortness of breath.   Gastrointestinal:  Positive for heartburn. Negative for diarrhea.  Endocrine: Negative for cold intolerance.  Genitourinary:  Negative for vaginal discharge.  Musculoskeletal:  Positive for neck pain.  All other systems reviewed and are negative.  Family History  Problem Relation Age of Onset   COPD Mother    Stroke Father    Seizures Sister    Hypertension Sister    Breast cancer Maternal Aunt    Breast cancer Paternal Aunt    Social History   Socioeconomic History   Marital status: Divorced    Spouse name: Not on file   Number of children: Not on file   Years of education: Not on file   Highest education level: Associate degree: occupational, scientist, product/process development, or vocational program  Occupational History   Not on file  Tobacco Use   Smoking status: Former    Current packs/day: 0.00    Types: Cigarettes    Quit date:  02/05/2002    Years since quitting: 22.5   Smokeless tobacco: Never  Vaping Use   Vaping status: Never Used  Substance and Sexual Activity   Alcohol use: Yes    Comment: occasional   Drug use: No   Sexual activity: Not on file  Other Topics Concern   Not on file  Social History  Narrative   Not on file   Social Drivers of Health   Financial Resource Strain: Low Risk  (08/13/2024)   Overall Financial Resource Strain (CARDIA)    Difficulty of Paying Living Expenses: Not hard at all  Food Insecurity: No Food Insecurity (08/13/2024)   Hunger Vital Sign    Worried About Running Out of Food in the Last Year: Never true    Ran Out of Food in the Last Year: Never true  Transportation Needs: No Transportation Needs (08/13/2024)   PRAPARE - Administrator, Civil Service (Medical): No    Lack of Transportation (Non-Medical): No  Physical Activity: Sufficiently Active (08/13/2024)   Exercise Vital Sign    Days of Exercise per Week: 6 days    Minutes of Exercise per Session: 50 min  Stress: No Stress Concern Present (08/13/2024)   [employer] of Occupational Health - Occupational Stress Questionnaire    Feeling of Stress: Not at all  Social Connections: Unknown (08/13/2024)   Social Connection and Isolation Panel    Frequency of Communication with Friends and Family: More than three times a week    Frequency of Social Gatherings with Friends and Family: Once a week    Attends Religious Services: More than 4 times per year    Active Member of [employer] or Organizations: Yes    Attends Banker Meetings: 1 to 4 times per year    Marital Status: Patient declined        Objective:   Physical Exam Vitals reviewed.  Constitutional:      General: She is not in acute distress.    Appearance: She is well-developed.  HENT:     Head: Normocephalic and atraumatic.     Right Ear: Tympanic membrane normal.     Left Ear: Tympanic membrane normal.  Eyes:     Pupils: Pupils are equal, round, and reactive to light.  Neck:     Thyroid : No thyromegaly.  Cardiovascular:     Rate and Rhythm: Normal rate and regular rhythm.     Heart sounds: Normal heart sounds. No murmur heard. Pulmonary:     Effort: Pulmonary effort is normal. No respiratory  distress.     Breath sounds: Normal breath sounds. No wheezing.  Chest:  Breasts:    Right: No swelling, bleeding, inverted nipple, mass, nipple discharge, skin change or tenderness.     Left: No swelling, bleeding, inverted nipple, mass, nipple discharge, skin change or tenderness.  Abdominal:     General: Bowel sounds are normal. There is no distension.     Palpations: Abdomen is soft.     Tenderness: There is no abdominal tenderness.  Genitourinary:    Comments: Bimanual exam- no adnexal masses or tenderness, ovaries nonpalpable   Cervix parous and pink- No discharge  Musculoskeletal:        General: No tenderness. Normal range of motion.     Cervical back: Normal range of motion and neck supple.     Comments: Full ROM of neck   Skin:    General: Skin is warm and dry.  Neurological:  Mental Status: She is alert and oriented to person, place, and time.     Cranial Nerves: No cranial nerve deficit.     Deep Tendon Reflexes: Reflexes are normal and symmetric.  Psychiatric:        Behavior: Behavior normal.        Thought Content: Thought content normal.        Judgment: Judgment normal.       BP 126/85   Pulse 63   Temp 98.1 F (36.7 C) (Temporal)   Ht 5' 5 (1.651 m)   Wt 156 lb 6.4 oz (70.9 kg)   BMI 26.03 kg/m      Assessment & Plan:  SUNG RENTON comes in today with chief complaint of Medical Management of Chronic Issues and Gynecologic Exam   Diagnosis and orders addressed:  1. Anxiety - diazepam  (VALIUM ) 5 MG tablet; Take 1 tablet (5 mg total) by mouth every 12 (twelve) hours as needed for anxiety.  Dispense: 20 tablet; Refill: 3 - ToxASSURE Select 13 (MW), Urine - CMP14+EGFR  2. Controlled substance agreement signed - diazepam  (VALIUM ) 5 MG tablet; Take 1 tablet (5 mg total) by mouth every 12 (twelve) hours as needed for anxiety.  Dispense: 20 tablet; Refill: 3 - ToxASSURE Select 13 (MW), Urine - CMP14+EGFR  3. Hot flash not due to  menopause - gabapentin  (NEURONTIN ) 600 MG tablet; Take 1 tablet (600 mg total) by mouth at bedtime.  Dispense: 90 tablet; Refill: 0 - CMP14+EGFR  4. Essential hypertension - lisinopril -hydrochlorothiazide  (ZESTORETIC ) 20-12.5 MG tablet; Take 1 tablet by mouth daily.  Dispense: 90 tablet; Refill: 1 - CMP14+EGFR  5. Gastroesophageal reflux disease, unspecified whether esophagitis present - pantoprazole  (PROTONIX ) 20 MG tablet; Take 1 tablet (20 mg total) by mouth daily.  Dispense: 90 tablet; Refill: 1 - CMP14+EGFR  6. Hypothyroidism, unspecified type (Primary) - CMP14+EGFR - TSH  7. Mixed hyperlipidemia - CMP14+EGFR  8. Neck pain - ToxASSURE Select 13 (MW), Urine - CMP14+EGFR  9. Encounter for gynecological examination without abnormal finding - CMP14+EGFR - Cytology - PAP([place])   Labs reviewed Patient reviewed in  controlled database, no flags noted. Contract and drug screen updated today.  Health Maintenance reviewed Diet and exercise encouraged  Follow up plan: 6 months    Bari Learn, FNP

## 2024-08-15 LAB — CMP14+EGFR
ALT: 16 IU/L (ref 0–32)
AST: 22 IU/L (ref 0–40)
Albumin: 4.4 g/dL (ref 3.9–4.9)
Alkaline Phosphatase: 64 IU/L (ref 49–135)
BUN/Creatinine Ratio: 39 — ABNORMAL HIGH (ref 12–28)
BUN: 22 mg/dL (ref 8–27)
Bilirubin Total: 0.3 mg/dL (ref 0.0–1.2)
CO2: 25 mmol/L (ref 20–29)
Calcium: 9.9 mg/dL (ref 8.7–10.3)
Chloride: 100 mmol/L (ref 96–106)
Creatinine, Ser: 0.56 mg/dL — ABNORMAL LOW (ref 0.57–1.00)
Globulin, Total: 2.7 g/dL (ref 1.5–4.5)
Glucose: 73 mg/dL (ref 70–99)
Potassium: 4 mmol/L (ref 3.5–5.2)
Sodium: 140 mmol/L (ref 134–144)
Total Protein: 7.1 g/dL (ref 6.0–8.5)
eGFR: 101 mL/min/1.73 (ref >59)

## 2024-08-15 LAB — TSH: TSH: 0.777 u[IU]/mL (ref 0.450–4.500)

## 2024-08-17 ENCOUNTER — Ambulatory Visit: Payer: Self-pay | Admitting: Family

## 2024-08-17 LAB — CYTOLOGY - PAP: Diagnosis: NEGATIVE

## 2024-08-19 LAB — TOXASSURE SELECT 13 (MW), URINE

## 2025-02-19 ENCOUNTER — Ambulatory Visit: Admitting: Family

## 2025-08-27 ENCOUNTER — Encounter: Admitting: Family
